# Patient Record
Sex: Female | Born: 1985 | Race: Black or African American | Hispanic: No | Marital: Single | Smoking: Never smoker
Health system: Southern US, Community
[De-identification: ages and names within clinical notes are randomized; demographics above are authoritative.]

## PROBLEM LIST (undated history)

## (undated) ENCOUNTER — Ambulatory Visit (HOSPITAL_COMMUNITY): Admission: EM | Payer: 59

## (undated) DIAGNOSIS — Z789 Other specified health status: Secondary | ICD-10-CM

## (undated) DIAGNOSIS — D649 Anemia, unspecified: Secondary | ICD-10-CM

## (undated) HISTORY — DX: Anemia, unspecified: D64.9

## (undated) HISTORY — PX: NO PAST SURGERIES: SHX2092

---

## 2010-10-21 ENCOUNTER — Inpatient Hospital Stay (HOSPITAL_COMMUNITY): Payer: Self-pay

## 2010-10-21 ENCOUNTER — Encounter (HOSPITAL_COMMUNITY): Payer: Self-pay | Admitting: *Deleted

## 2010-10-21 ENCOUNTER — Inpatient Hospital Stay (HOSPITAL_COMMUNITY)
Admission: AD | Admit: 2010-10-21 | Discharge: 2010-10-22 | Disposition: A | Discharge status: Home / Self Care | Payer: Self-pay | Source: Ambulatory Visit | Attending: Obstetrics & Gynecology | Admitting: Obstetrics & Gynecology

## 2010-10-21 DIAGNOSIS — O479 False labor, unspecified: Secondary | ICD-10-CM

## 2010-10-21 LAB — CBC
HCT: 31.1 % — ABNORMAL LOW (ref 36.0–46.0)
MCHC: 35 g/dL (ref 30.0–36.0)
RDW: 14.3 % (ref 11.5–15.5)

## 2010-10-21 LAB — DIFFERENTIAL
Basophils Absolute: 0 10*3/uL (ref 0.0–0.1)
Basophils Relative: 0 % (ref 0–1)
Eosinophils Absolute: 0 10*3/uL (ref 0.0–0.7)
Eosinophils Relative: 0 % (ref 0–5)
Monocytes Absolute: 0.6 10*3/uL (ref 0.1–1.0)
Neutro Abs: 4.9 10*3/uL (ref 1.7–7.7)

## 2010-10-21 LAB — URINALYSIS, ROUTINE W REFLEX MICROSCOPIC
Nitrite: NEGATIVE
Specific Gravity, Urine: 1.02 (ref 1.005–1.030)
Urobilinogen, UA: 0.2 mg/dL (ref 0.0–1.0)

## 2010-10-21 LAB — URINE MICROSCOPIC-ADD ON

## 2010-10-21 LAB — RAPID HIV SCREEN (WH-MAU): Rapid HIV Screen: NONREACTIVE

## 2010-10-21 NOTE — Progress Notes (Signed)
Pt states she has been having pain all week, but pain has become worse today at about 4pm

## 2010-10-21 NOTE — ED Provider Notes (Addendum)
Jasmine Silva is a 25 y.o. female G2P1 at [redacted]w[redacted]d presenting for contractions.  Contractions have been occuring on and off for several days, but increased in frequency and strength this afternoon.  States coming every 3-4 minutes for a while, then tapers off then back to every 3-4 minutes.    No loss of fluid or vaginal bleeding.  Good fetal movement.  Received prenatal care in [place], moved to [place] 4 weeks ago.  States no problems with this pregnancy; no diabetes or high blood pressure.  Prior pregnancy and delivery was normal.  Denies headache, n/v/d, edema.  History OB History    Grav Para Term Preterm Abortions TAB SAB Ect Mult Living   2 1 1  0 0 0 0 0 0 1     No past medical or surgical history  Family History: No history of problems in pregnancy or labor, no history of DVT, no history of DM or HTN Social History:  reports that she has never smoked. She has never used smokeless tobacco. She reports that she does not drink alcohol or use illicit drugs.  No Known Allergies Medications: Prenatal vitamin  ROS Please see HPI.    Blood pressure 130/71, pulse 100, temperature 98 F (36.7 C), temperature source Oral, resp. rate 18, last menstrual period 10/21/2010. Exam Physical Exam  General: alert, mild distress with contraction, well-nourished, well hydrated HENT: [place]/AT, MMM CV: RRR, no murmurs Pulm: CTAB, no wheezes, rales, rhonchi Abd: soft, +BS, gravid with size consistent with dates; vertex by leopold's Ext: no edema, good peripheral pulses  Cervix: 1/50/-3  FHT: 140, moderate variability, accels present, no decels Contractions: every 8 minutes  Prenatal labs: ABO, Rh:   Antibody:   Rubella:   RPR:    HBsAg:    HIV:    GBS:     Assessment/Plan: 25 year old G2P1 at [redacted]w[redacted]d presenting with intermittent contractions, braxton-hicks vs very early labor.  -prenatal labs, GBS -will also get BPP and AFI due to periods of non-reactive strip -labor precautions -discharge  home  BOOTH, ERIN 10/21/2010, 9:14 PM

## 2010-10-22 LAB — RPR: RPR Ser Ql: NONREACTIVE

## 2010-10-22 NOTE — Initial Assessments (Signed)
BPP and AFI ordered by Dr. Elwyn Reach for furhter evaluation, after discharge orders printed.  Pt discharged after reports from both tests.

## 2010-10-24 LAB — CULTURE, BETA STREP (GROUP B ONLY)

## 2010-10-27 ENCOUNTER — Encounter (HOSPITAL_COMMUNITY): Payer: Self-pay | Admitting: *Deleted

## 2010-10-27 ENCOUNTER — Telehealth (HOSPITAL_COMMUNITY): Payer: Self-pay | Admitting: *Deleted

## 2010-10-27 ENCOUNTER — Ambulatory Visit (INDEPENDENT_AMBULATORY_CARE_PROVIDER_SITE_OTHER): Payer: Self-pay | Admitting: Family Medicine

## 2010-10-27 DIAGNOSIS — O0930 Supervision of pregnancy with insufficient antenatal care, unspecified trimester: Secondary | ICD-10-CM

## 2010-10-27 DIAGNOSIS — O409XX0 Polyhydramnios, unspecified trimester, not applicable or unspecified: Secondary | ICD-10-CM

## 2010-10-27 DIAGNOSIS — O480 Post-term pregnancy: Secondary | ICD-10-CM

## 2010-10-27 LAB — POCT URINALYSIS DIP (DEVICE)
Hgb urine dipstick: NEGATIVE
Nitrite: NEGATIVE
Specific Gravity, Urine: 1.02 (ref 1.005–1.030)
Urobilinogen, UA: 0.2 mg/dL (ref 0.0–1.0)
pH: 7 (ref 5.0–8.0)

## 2010-10-27 NOTE — Progress Notes (Signed)
Subjective:    Jasmine Silva is a 25 y.o. female being seen today for her obstetrical visit. She is at [redacted]w[redacted]d gestation. Patient reports backache and contractions since 2 weeks as well as contractions for 2 weeks approximately 3x per day. She was admitted on 8/30 for contractions and at that time her cervix was 1/50/-3 and had prenatal labs at that time including GBS (-). Fetal movement: normal. No rush of fluid. No vaginal bleeding.   Patient moved from [place] 5 weeks ago to stay with her mother. She had prenatal care in [place] but did not bring records. She was told in [place] that she was having a healthy pregnancy. She reports no medical problems and an uncomplicated first pregnancy (including no hypertension or DM).   Menstrual History: OB History    Grav Para Term Preterm Abortions TAB SAB Ect Mult Living   2 1 1  0 0 0 0 0 0 1         The following portions of the patient's history were reviewed and updated as appropriate: allergies, current medications, past family history, past medical history, past social history, past surgical history and problem list.   Review of Systems Pertinent items are noted in HPI.   Objective:    BP 114/81  Temp 97 F (36.1 C)  Wt 170 lb 12.8 oz (77.474 kg)  LMP 10/21/2010 FHT: 140 BPM  Uterine Size: 40 cm  Presentations: vertex  Pelvic Exam:              Dilation: 0.5 fingertip       Effacement: soft   Station:  -3     Consistency: soft        NST today with 140 BPM, irregular cx, reactive, no decreased variability Assessment:   [name] is a 25 y.o. female being seen today for her obstetrical visit. She is at [redacted]w[redacted]d gestation. Patient interested in induction.  Prenatal labs unremarkable-O Pos. Hep B/HIV/Syphilis negative. Rubella immune. GBS negative.   Plan:    Postdates management: discussed fetal surveillance and induction, NST reactive. Induction: scheduled for 10/29/10 at 7:30 AM.  Follow up for bleeding, contractions,  rush of fluid, or decreased fetal movement.

## 2010-10-27 NOTE — Telephone Encounter (Signed)
Preadmission screen  

## 2010-10-27 NOTE — Progress Notes (Signed)
Having some pelvic pain and pressure. Pt given education materials.

## 2010-10-27 NOTE — Progress Notes (Signed)
Pt is unsure if she wants IOL vs waiting for spont. labor

## 2010-10-29 ENCOUNTER — Encounter (HOSPITAL_COMMUNITY): Payer: Self-pay

## 2010-10-29 ENCOUNTER — Inpatient Hospital Stay (HOSPITAL_COMMUNITY)
Admission: RE | Admit: 2010-10-29 | Discharge: 2010-11-02 | DRG: 765 | Disposition: A | Discharge status: Home / Self Care | Payer: Medicaid Other | Source: Ambulatory Visit | Attending: Family Medicine | Admitting: Family Medicine

## 2010-10-29 DIAGNOSIS — O480 Post-term pregnancy: Principal | ICD-10-CM | POA: Diagnosis present

## 2010-10-29 DIAGNOSIS — O411090 Infection of amniotic sac and membranes, unspecified, unspecified trimester, not applicable or unspecified: Secondary | ICD-10-CM | POA: Diagnosis present

## 2010-10-29 HISTORY — DX: Other specified health status: Z78.9

## 2010-10-29 LAB — RPR: RPR Ser Ql: NONREACTIVE

## 2010-10-29 LAB — CBC
Platelets: 191 10*3/uL (ref 150–400)
RDW: 14.3 % (ref 11.5–15.5)
WBC: 6.9 10*3/uL (ref 4.0–10.5)

## 2010-10-29 MED ORDER — TERBUTALINE SULFATE 1 MG/ML IJ SOLN: 0.2500 mg | Freq: Once | INTRAMUSCULAR | Status: AC | PRN

## 2010-10-29 MED ORDER — PHENYLEPHRINE 40 MCG/ML (10ML) SYRINGE FOR IV PUSH (FOR BLOOD PRESSURE SUPPORT)
80.0000 ug | PREFILLED_SYRINGE | INTRAVENOUS | Status: DC | PRN
  Filled 2010-10-29: qty 5

## 2010-10-29 MED ORDER — OXYTOCIN 20 UNITS IN LACTATED RINGERS INFUSION - SIMPLE
125.0000 mL/h | Freq: Once | INTRAVENOUS | Status: DC
  Filled 2010-10-29: qty 1000

## 2010-10-29 MED ORDER — OXYTOCIN 20 UNITS IN LACTATED RINGERS INFUSION - SIMPLE
1.0000 m[IU]/min | INTRAVENOUS | Status: DC
  Administered 2010-10-29: 18 m[IU]/min via INTRAVENOUS
  Administered 2010-10-29: 10 m[IU]/min via INTRAVENOUS
  Administered 2010-10-29: 12 m[IU]/min via INTRAVENOUS
  Administered 2010-10-29: 20 m[IU]/min via INTRAVENOUS
  Administered 2010-10-29: 14 m[IU]/min via INTRAVENOUS
  Administered 2010-10-29: 8 m[IU]/min via INTRAVENOUS
  Administered 2010-10-29: 16 m[IU]/min via INTRAVENOUS
  Administered 2010-10-29: 2 m[IU]/min via INTRAVENOUS
  Administered 2010-10-30: 22 m[IU]/min via INTRAVENOUS
  Administered 2010-10-30: 26 m[IU]/min via INTRAVENOUS
  Administered 2010-10-30: 24 m[IU]/min via INTRAVENOUS

## 2010-10-29 MED ORDER — LIDOCAINE HCL (PF) 1 % IJ SOLN
30.0000 mL | INTRAMUSCULAR | Status: DC | PRN
  Filled 2010-10-29: qty 30

## 2010-10-29 MED ORDER — OXYTOCIN BOLUS FROM INFUSION
500.0000 mL | Freq: Once | INTRAVENOUS | Status: DC
  Filled 2010-10-29: qty 1000
  Filled 2010-10-29: qty 500

## 2010-10-29 MED ORDER — LACTATED RINGERS IV SOLN
500.0000 mL | INTRAVENOUS | Status: DC | PRN
  Administered 2010-10-30 (×2): 500 mL via INTRAVENOUS

## 2010-10-29 MED ORDER — MISOPROSTOL 25 MCG QUARTER TABLET
25.0000 ug | ORAL_TABLET | ORAL | Status: DC | PRN
  Administered 2010-10-29: 25 ug via VAGINAL
  Filled 2010-10-29: qty 1
  Filled 2010-10-29 (×2): qty 0.25

## 2010-10-29 MED ORDER — OXYCODONE-ACETAMINOPHEN 5-325 MG PO TABS: 2.0000 | ORAL_TABLET | ORAL | Status: DC | PRN

## 2010-10-29 MED ORDER — CITRIC ACID-SODIUM CITRATE 334-500 MG/5ML PO SOLN
30.0000 mL | ORAL | Status: DC | PRN
  Filled 2010-10-29: qty 15

## 2010-10-29 MED ORDER — IBUPROFEN 600 MG PO TABS
600.0000 mg | ORAL_TABLET | Freq: Four times a day (QID) | ORAL | Status: DC | PRN
  Filled 2010-10-29: qty 1

## 2010-10-29 MED ORDER — DIPHENHYDRAMINE HCL 50 MG/ML IJ SOLN: 12.5000 mg | INTRAMUSCULAR | Status: DC | PRN

## 2010-10-29 MED ORDER — FLEET ENEMA 7-19 GM/118ML RE ENEM: 1.0000 | ENEMA | RECTAL | Status: DC | PRN

## 2010-10-29 MED ORDER — ONDANSETRON HCL 4 MG/2ML IJ SOLN: 4.0000 mg | Freq: Four times a day (QID) | INTRAMUSCULAR | Status: DC | PRN

## 2010-10-29 MED ORDER — ACETAMINOPHEN 325 MG PO TABS
650.0000 mg | ORAL_TABLET | ORAL | Status: DC | PRN
  Administered 2010-10-30: 650 mg via ORAL
  Filled 2010-10-29: qty 2

## 2010-10-29 MED ORDER — LACTATED RINGERS IV SOLN
INTRAVENOUS | Status: DC
  Administered 2010-10-29 – 2010-10-30 (×8): via INTRAVENOUS

## 2010-10-29 MED ORDER — PHENYLEPHRINE 40 MCG/ML (10ML) SYRINGE FOR IV PUSH (FOR BLOOD PRESSURE SUPPORT)
80.0000 ug | PREFILLED_SYRINGE | INTRAVENOUS | Status: DC | PRN
  Filled 2010-10-29 (×2): qty 5

## 2010-10-29 MED ORDER — EPHEDRINE 5 MG/ML INJ
10.0000 mg | INTRAVENOUS | Status: DC | PRN
  Filled 2010-10-29 (×2): qty 4

## 2010-10-29 MED ORDER — LACTATED RINGERS IV SOLN: 500.0000 mL | Freq: Once | INTRAVENOUS | Status: DC

## 2010-10-29 MED ORDER — EPHEDRINE 5 MG/ML INJ
10.0000 mg | INTRAVENOUS | Status: DC | PRN
  Filled 2010-10-29: qty 4

## 2010-10-29 MED ORDER — BUTORPHANOL TARTRATE 2 MG/ML IJ SOLN
2.0000 mg | INTRAMUSCULAR | Status: DC | PRN
  Administered 2010-10-29: 2 mg via INTRAVENOUS
  Filled 2010-10-29: qty 1

## 2010-10-29 MED ORDER — FENTANYL 2.5 MCG/ML BUPIVACAINE 1/10 % EPIDURAL INFUSION (WH - ANES)
14.0000 mL/h | INTRAMUSCULAR | Status: DC
  Administered 2010-10-30 (×2): 14 mL/h via EPIDURAL
  Filled 2010-10-29 (×3): qty 60

## 2010-10-29 NOTE — Progress Notes (Signed)
Jasmine Silva is a 25 y.o. G2P1001 at [redacted]w[redacted]d by LMP admitted for induction of labor due to Post dates.  Subjective: Patient sleeping.  Per nursing, pt states contractions every 8-10 minutes, having some back pain.  Objective: BP 129/73  Pulse 93  Temp(Src) 98 F (36.7 C) (Oral)  Resp 20  Ht 5\' 2"  (1.575 m)  Wt 174 lb (78.926 kg)  BMI 31.83 kg/m2  LMP 10/21/2010      FHT:  FHR: 135 bpm, variability: moderate,  accelerations:  Abscent,  decelerations:  Absent UC:   irregular, every 8-10 minutes SVE:   Dilation: Fingertip Effacement (%): 50 Station: -3 Exam by:: Dr Elwyn Reach  Labs: Lab Results  Component Value Date   WBC 6.9 10/29/2010   HGB 10.7* 10/29/2010   HCT 30.9* 10/29/2010   MCV 79.4 10/29/2010   PLT 191 10/29/2010    Assessment / Plan: Induction of labor due to postterm, on cytotec  Labor: cytotec placed at 0930 Preeclampsia:  n/a Fetal Wellbeing:  Category I Pain Control:  Labor support without medications and planning on epidural I/D:  n/a Anticipated MOD:  NSVD  BOOTH, Adelina Collard 10/29/2010, 11:43 AM

## 2010-10-29 NOTE — H&P (Signed)
Agree with above H&P documentation.

## 2010-10-29 NOTE — Plan of Care (Signed)
Problem: Consults Goal: Birthing Suites Patient Information Press F2 to bring up selections list   Pt > [redacted] weeks EGA     

## 2010-10-29 NOTE — Progress Notes (Signed)
Dewayne Jurek is a 25 y.o. G2P1001 at [redacted]w[redacted]d by LMP admitted for induction of labor due to Post dates. Due date 78 Sept 2012.  Subjective:   Objective: BP 116/63  Pulse 91  Temp(Src) 98 F (36.7 C) (Oral)  Resp 20  Ht 5\' 2"  (1.575 m)  Wt 78.926 kg (174 lb)  BMI 31.83 kg/m2  LMP 10/21/2010      FHT:  FHR: 135 bpm, variability: moderate,  accelerations:  Present,  decelerations:  Absent UC:   none SVE:   Dilation: 1 Effacement (%): 50 Station: -3 Exam by:: Dr Natale Milch  Labs: Lab Results  Component Value Date   WBC 6.9 10/29/2010   HGB 10.7* 10/29/2010   HCT 30.9* 10/29/2010   MCV 79.4 10/29/2010   PLT 191 10/29/2010    Assessment / Plan: IOL: Foley Bulb placed Fetal Wellbeing:  Category I Pain Control:  Labor support without medications I/D:  n/a Anticipated MOD:  NSVD  Demir Titsworth N 10/29/2010, 1:37 PM

## 2010-10-29 NOTE — Progress Notes (Signed)
Jasmine Silva is a 25 y.o. G2P1001 at [redacted]w[redacted]d, IOL for Postdates. Had Foley Bulb placed at 1330. Has been walking and noticed some blood tinge when last in restroom. Objective: BP 114/75  Pulse 86  Temp(Src) 98.6 F (37 C) (Oral)  Resp 20  Ht 5\' 2"  (1.575 m)  Wt 78.926 kg (174 lb)  BMI 31.83 kg/m2  LMP 10/21/2010      FHT:  FHR: 140s bpm, variability: moderate,  accelerations:  Present,  decelerations:  Absent UC:   regular, every 10 minutes SVE:   Dilation: 4 Effacement (%): 50 Station: -3 Exam by:: Dr Bradly Chris removed  Assessment / Plan: IOL progressing well. Will start pitocin now that foley out. Discussed with patient pain managment options.  Labor: Progressing normally Fetal Wellbeing:  Category I Pain Control:  Labor support without medications I/D:  n/a Anticipated MOD:  NSVD  Jasmine Silva N 10/29/2010, 5:35 PM

## 2010-10-29 NOTE — Progress Notes (Signed)
Dr. Elesa Massed Resident

## 2010-10-29 NOTE — Progress Notes (Signed)
Jasmine Silva is a 25 y.o. G2P1001 at [redacted]w[redacted]d Subjective: IOL for post-dates. S/p cytotec and FB. FB out @1730 . Resting comfortably on stadol. No complaints.  Objective: BP 117/71  Pulse 85  Temp(Src) 97.7 F (36.5 C) (Oral)  Resp 18  Ht 5\' 2"  (1.575 m)  Wt 174 lb (78.926 kg)  BMI 31.83 kg/m2  LMP 10/21/2010      FHT:  FHR: 130 bpm, variability: moderate,  accelerations:  Abscent,  decelerations:  Absent UC:   irregular, every 2-6 minutes SVE:   Dilation: 5 Effacement (%): 50 Station: -3 Exam by:: Dr. Natale Milch  Labs: Lab Results  Component Value Date   WBC 6.9 10/29/2010   HGB 10.7* 10/29/2010   HCT 30.9* 10/29/2010   MCV 79.4 10/29/2010   PLT 191 10/29/2010    Assessment / Plan: Induction of labor due to postterm,  progressing well on pitocin  Labor: Progressing Fetal Wellbeing:  Category I; some flattening of strip, likely 2/2 to stadol. Will continue to monitor Pain Control:  stadol Anticipated MOD:  NSVD  Jasmine Silva, [name] 10/29/2010, 7:56 PM

## 2010-10-29 NOTE — H&P (Signed)
Jasmine Silva is a 25 y.o. female G2P1001 at [redacted]w[redacted]d per patient report  presenting for IOL for post-dates. Reports contractions every 8-10 min.  Denies any loss of fluid.  +FM.  Denies any complications during pregnancy.  Received most of her prenatal care in home country of [place].  Denies any existing medical conditions prior to pregnancy.    Medications:  Prenatal Vitamins Allergies: No known allergies  History   OB History    Grav Para Term Preterm Abortions TAB SAB Ect Mult Living   2 1 1  0 0 0 0 0 0 1     Past Medical History  Diagnosis Date   No pertinent past medical history    Past Surgical History  Procedure Date   No past surgeries    Family History: No pertinent family history.   Social History:  reports that she has never smoked. She has never used smokeless tobacco. She reports that she does not drink alcohol or use illicit drugs.    ROS  Negative except as in HPI  Dilation: Fingertip Effacement (%): 50 Station: -3 Exam by:: Dr Elwyn Reach Blood pressure 132/79, pulse 95, temperature 97.9 F (36.6 C), temperature source Oral, resp. rate 20, height 5\' 2"  (1.575 m), weight 174 lb (78.926 kg), last menstrual period 10/21/2010. Exam Physical Exam  Gen: Alert, oriented x 3. Cooperative.  Some discomfort with contractions HEENT:  Normocephalic, atraumatic, sclera anicteric. Card:  Regular rate and rhythm. S1, S2.  No murmur/gallop/rubs Pulm:  CTAB.  No retractions or respiratory distress.  Abd:  Gravid, non-tender. Ext: no edema, warm and dry.  No erythema.  Distal pulses intact and strong.  FHT: 145, Mild variability, No Accel, No decel.  Contractions every 8-10 min     Prenatal labs: ABO, Rh: --/--/O POS (08/30 2125) Antibody: NEG (08/30 2123) Rubella:  Immune RPR: NON REACTIVE (08/30 2124)  HBsAg: NEGATIVE (08/30 2124)  HIV: Non-reactive (08/30 0000)  GBS: Negative (08/30 0000)   Assessment/Plan: 25 yo G2P1001 at [redacted]w[redacted]d presenting for IOL for post-dates.   GBS  Neg/Rubella Immune. Bishop Score 4. 1)Admit to L & D 2)Cytotec for cervical ripening 3) Continue routine monitoring   Adam Chenevert PA-S  10/29/2010, 9:01 AM

## 2010-10-29 NOTE — Progress Notes (Signed)
Jasmine Silva is a 25 y.o. G2P1001 at [redacted]w[redacted]d IOL for post-dates  Subjective: Doing well. Pain well-controlled. Still feeling contractions.   Objective: BP 130/82  Pulse 83  Temp(Src) 97.7 F (36.5 C) (Oral)  Resp 16  Ht 5\' 2"  (1.575 m)  Wt 174 lb (78.926 kg)  BMI 31.83 kg/m2  LMP 10/21/2010      FHT:  FHR: 130 bpm, variability: moderate,  accelerations:  Present,  decelerations:  Absent UC:   regular, every 4-5 minutes SVE:   Dilation: 5 Effacement (%): 50 Station: -3 Exam by::  (Nivaan Dicenzo MD)  Labs: Lab Results  Component Value Date   WBC 6.9 10/29/2010   HGB 10.7* 10/29/2010   HCT 30.9* 10/29/2010   MCV 79.4 10/29/2010   PLT 191 10/29/2010    Assessment / Plan: IOL for post-dates. Continue pitocin. No progress since last exam, however patient is s/p foley bulb.  Labor: Progressing on Pitocin, will continue to increase then AROM Fetal Wellbeing:  Category I Pain Control:  PO/IV meds Anticipated MOD:  NSVD  Ezra Marquess, Raina Mina 10/29/2010, 9:37 PM

## 2010-10-30 ENCOUNTER — Encounter (HOSPITAL_COMMUNITY)
Admission: RE | Disposition: A | Discharge status: Home / Self Care | Payer: Self-pay | Source: Ambulatory Visit | Attending: Family Medicine

## 2010-10-30 ENCOUNTER — Other Ambulatory Visit: Payer: Self-pay | Admitting: Obstetrics & Gynecology

## 2010-10-30 ENCOUNTER — Encounter (HOSPITAL_COMMUNITY): Payer: Self-pay

## 2010-10-30 ENCOUNTER — Encounter (HOSPITAL_COMMUNITY): Payer: Self-pay | Admitting: Anesthesiology

## 2010-10-30 ENCOUNTER — Inpatient Hospital Stay (HOSPITAL_COMMUNITY): Payer: Medicaid Other | Admitting: Anesthesiology

## 2010-10-30 DIAGNOSIS — O480 Post-term pregnancy: Secondary | ICD-10-CM

## 2010-10-30 DIAGNOSIS — O411090 Infection of amniotic sac and membranes, unspecified, unspecified trimester, not applicable or unspecified: Secondary | ICD-10-CM

## 2010-10-30 SURGERY — Surgical Case
Anesthesia: Regional

## 2010-10-30 SURGERY — Surgical Case
Anesthesia: Regional | Site: Abdomen | Wound class: Clean Contaminated

## 2010-10-30 MED ORDER — SODIUM BICARBONATE 8.4 % IV SOLN
INTRAVENOUS | Status: DC | PRN
  Administered 2010-10-30: 5 mL via EPIDURAL

## 2010-10-30 MED ORDER — OXYTOCIN 20 UNITS IN LACTATED RINGERS INFUSION - SIMPLE
125.0000 mL/h | INTRAVENOUS | Status: AC
  Administered 2010-10-30: 125 mL/h via INTRAVENOUS

## 2010-10-30 MED ORDER — NALBUPHINE HCL 10 MG/ML IJ SOLN
5.0000 mg | INTRAMUSCULAR | Status: DC | PRN
  Filled 2010-10-30: qty 1

## 2010-10-30 MED ORDER — PHENYLEPHRINE HCL 10 MG/ML IJ SOLN
INTRAMUSCULAR | Status: DC | PRN
  Administered 2010-10-30 (×2): 80 ug via INTRAVENOUS
  Administered 2010-10-30: 120 ug via INTRAVENOUS
  Administered 2010-10-30: 80 ug via INTRAVENOUS
  Administered 2010-10-30: 120 ug via INTRAVENOUS
  Administered 2010-10-30: 80 ug via INTRAVENOUS
  Administered 2010-10-30: 120 ug via INTRAVENOUS
  Administered 2010-10-30: 80 ug via INTRAVENOUS
  Administered 2010-10-30 (×2): 120 ug via INTRAVENOUS
  Administered 2010-10-30: 80 ug via INTRAVENOUS
  Administered 2010-10-30: 120 ug via INTRAVENOUS

## 2010-10-30 MED ORDER — MORPHINE SULFATE (PF) 0.5 MG/ML IJ SOLN
INTRAMUSCULAR | Status: DC | PRN
  Administered 2010-10-30: 4 mg via EPIDURAL

## 2010-10-30 MED ORDER — GENTAMICIN SULFATE 40 MG/ML IJ SOLN
160.0000 mg | Freq: Once | INTRAVENOUS | Status: AC
  Administered 2010-10-30: 160 mg via INTRAVENOUS
  Filled 2010-10-30: qty 4

## 2010-10-30 MED ORDER — NALOXONE HCL 0.4 MG/ML IJ SOLN: 0.4000 mg | INTRAMUSCULAR | Status: DC | PRN

## 2010-10-30 MED ORDER — SODIUM CHLORIDE 0.9 % IR SOLN
Status: DC | PRN
  Administered 2010-10-30: 1000 mL

## 2010-10-30 MED ORDER — DIPHENHYDRAMINE HCL 25 MG PO CAPS: 25.0000 mg | ORAL_CAPSULE | ORAL | Status: DC | PRN

## 2010-10-30 MED ORDER — SODIUM CHLORIDE 0.9 % IV SOLN
2.0000 g | Freq: Four times a day (QID) | INTRAVENOUS | Status: DC
  Administered 2010-10-30 – 2010-11-01 (×7): 2 g via INTRAVENOUS
  Filled 2010-10-30 (×11): qty 2000

## 2010-10-30 MED ORDER — DIPHENHYDRAMINE HCL 25 MG PO CAPS: 25.0000 mg | ORAL_CAPSULE | Freq: Four times a day (QID) | ORAL | Status: DC | PRN

## 2010-10-30 MED ORDER — FENTANYL CITRATE 0.05 MG/ML IJ SOLN
INTRAMUSCULAR | Status: AC
  Filled 2010-10-30: qty 2

## 2010-10-30 MED ORDER — PHENYLEPHRINE 40 MCG/ML (10ML) SYRINGE FOR IV PUSH (FOR BLOOD PRESSURE SUPPORT)
PREFILLED_SYRINGE | INTRAVENOUS | Status: AC
  Filled 2010-10-30: qty 5

## 2010-10-30 MED ORDER — SODIUM CHLORIDE 0.9 % IJ SOLN
3.0000 mL | INTRAMUSCULAR | Status: DC | PRN
  Administered 2010-11-01 (×2): 3 mL via INTRAVENOUS

## 2010-10-30 MED ORDER — SODIUM CHLORIDE 0.9 % IV SOLN
1.0000 ug/kg/h | INTRAVENOUS | Status: DC | PRN
  Filled 2010-10-30: qty 2.5

## 2010-10-30 MED ORDER — GENTAMICIN SULFATE 40 MG/ML IJ SOLN
150.0000 mg | Freq: Three times a day (TID) | INTRAVENOUS | Status: DC
  Administered 2010-10-30 – 2010-11-01 (×5): 150 mg via INTRAVENOUS
  Filled 2010-10-30 (×7): qty 3.75

## 2010-10-30 MED ORDER — KETOROLAC TROMETHAMINE 30 MG/ML IJ SOLN: 30.0000 mg | Freq: Four times a day (QID) | INTRAMUSCULAR | Status: AC | PRN

## 2010-10-30 MED ORDER — KETOROLAC TROMETHAMINE 60 MG/2ML IM SOLN
60.0000 mg | Freq: Once | INTRAMUSCULAR | Status: AC | PRN
  Filled 2010-10-30: qty 2

## 2010-10-30 MED ORDER — SENNOSIDES-DOCUSATE SODIUM 8.6-50 MG PO TABS
2.0000 | ORAL_TABLET | Freq: Every day | ORAL | Status: DC
  Administered 2010-11-01: 2 via ORAL

## 2010-10-30 MED ORDER — MORPHINE SULFATE (PF) 0.5 MG/ML IJ SOLN
INTRAMUSCULAR | Status: DC | PRN
  Administered 2010-10-30: 1 mg via INTRAVENOUS

## 2010-10-30 MED ORDER — CEFAZOLIN SODIUM 1-5 GM-% IV SOLN
INTRAVENOUS | Status: AC
  Filled 2010-10-30: qty 100

## 2010-10-30 MED ORDER — ONDANSETRON HCL 4 MG/2ML IJ SOLN: 4.0000 mg | INTRAMUSCULAR | Status: DC | PRN

## 2010-10-30 MED ORDER — SIMETHICONE 80 MG PO CHEW
80.0000 mg | CHEWABLE_TABLET | Freq: Three times a day (TID) | ORAL | Status: DC
  Administered 2010-10-31 – 2010-11-02 (×8): 80 mg via ORAL

## 2010-10-30 MED ORDER — KETOROLAC TROMETHAMINE 30 MG/ML IJ SOLN
30.0000 mg | Freq: Four times a day (QID) | INTRAMUSCULAR | Status: AC | PRN
  Administered 2010-10-31: 30 mg via INTRAVENOUS
  Filled 2010-10-30: qty 1

## 2010-10-30 MED ORDER — MORPHINE SULFATE 0.5 MG/ML IJ SOLN
INTRAMUSCULAR | Status: AC
  Filled 2010-10-30: qty 10

## 2010-10-30 MED ORDER — SIMETHICONE 80 MG PO CHEW: 80.0000 mg | CHEWABLE_TABLET | ORAL | Status: DC | PRN

## 2010-10-30 MED ORDER — ACETAMINOPHEN 325 MG PO TABS
650.0000 mg | ORAL_TABLET | Freq: Four times a day (QID) | ORAL | Status: DC | PRN
  Administered 2010-10-30: 650 mg via ORAL
  Filled 2010-10-30: qty 2

## 2010-10-30 MED ORDER — LACTATED RINGERS IV SOLN
INTRAVENOUS | Status: DC
  Administered 2010-10-31 (×2): via INTRAVENOUS

## 2010-10-30 MED ORDER — OXYTOCIN 20 UNITS IN LACTATED RINGERS INFUSION - SIMPLE
INTRAVENOUS | Status: AC
  Filled 2010-10-30: qty 1000

## 2010-10-30 MED ORDER — TETANUS-DIPHTH-ACELL PERTUSSIS 5-2.5-18.5 LF-MCG/0.5 IM SUSP
0.5000 mL | Freq: Once | INTRAMUSCULAR | Status: AC
  Administered 2010-11-01: 0.5 mL via INTRAMUSCULAR
  Filled 2010-10-30: qty 0.5

## 2010-10-30 MED ORDER — SCOPOLAMINE 1 MG/3DAYS TD PT72
1.0000 | MEDICATED_PATCH | Freq: Once | TRANSDERMAL | Status: DC
  Administered 2010-10-30: 1.5 mg via TRANSDERMAL

## 2010-10-30 MED ORDER — OXYCODONE-ACETAMINOPHEN 5-325 MG PO TABS
1.0000 | ORAL_TABLET | ORAL | Status: DC | PRN
  Administered 2010-10-31 – 2010-11-01 (×4): 1 via ORAL
  Administered 2010-11-01: 2 via ORAL
  Filled 2010-10-30: qty 1
  Filled 2010-10-30: qty 2
  Filled 2010-10-30 (×3): qty 1

## 2010-10-30 MED ORDER — ONDANSETRON HCL 4 MG PO TABS: 4.0000 mg | ORAL_TABLET | ORAL | Status: DC | PRN

## 2010-10-30 MED ORDER — MEPERIDINE HCL 25 MG/ML IJ SOLN
INTRAMUSCULAR | Status: AC
  Administered 2010-10-30: 12.5 mg via INTRAVENOUS
  Filled 2010-10-30: qty 1

## 2010-10-30 MED ORDER — OXYTOCIN 10 UNIT/ML IJ SOLN
INTRAMUSCULAR | Status: AC
  Filled 2010-10-30: qty 4

## 2010-10-30 MED ORDER — MEPERIDINE HCL 25 MG/ML IJ SOLN
INTRAMUSCULAR | Status: DC | PRN
  Administered 2010-10-30: 25 mg via INTRAVENOUS

## 2010-10-30 MED ORDER — ONDANSETRON HCL 4 MG/2ML IJ SOLN
INTRAMUSCULAR | Status: DC | PRN
  Administered 2010-10-30: 4 mg via INTRAVENOUS

## 2010-10-30 MED ORDER — PHENYLEPHRINE 40 MCG/ML (10ML) SYRINGE FOR IV PUSH (FOR BLOOD PRESSURE SUPPORT)
PREFILLED_SYRINGE | INTRAVENOUS | Status: AC
  Filled 2010-10-30: qty 10

## 2010-10-30 MED ORDER — DIBUCAINE 1 % RE OINT: 1.0000 "application " | TOPICAL_OINTMENT | RECTAL | Status: DC | PRN

## 2010-10-30 MED ORDER — CEFAZOLIN SODIUM 1-5 GM-% IV SOLN
INTRAVENOUS | Status: DC | PRN
  Administered 2010-10-30: 1 g via INTRAVENOUS

## 2010-10-30 MED ORDER — LACTATED RINGERS IV SOLN
INTRAVENOUS | Status: DC | PRN
  Administered 2010-10-30: 18:00:00 via INTRAVENOUS

## 2010-10-30 MED ORDER — MENTHOL 3 MG MT LOZG: 1.0000 | LOZENGE | OROMUCOSAL | Status: DC | PRN

## 2010-10-30 MED ORDER — FENTANYL CITRATE 0.05 MG/ML IJ SOLN
INTRAMUSCULAR | Status: DC | PRN
  Administered 2010-10-30: 100 ug via INTRAVENOUS

## 2010-10-30 MED ORDER — MEPERIDINE HCL 25 MG/ML IJ SOLN
INTRAMUSCULAR | Status: AC
  Filled 2010-10-30: qty 1

## 2010-10-30 MED ORDER — OXYTOCIN 20 UNITS IN LACTATED RINGERS INFUSION - SIMPLE
1.0000 m[IU]/min | INTRAVENOUS | Status: DC
  Administered 2010-10-30: 14 m[IU]/min via INTRAVENOUS

## 2010-10-30 MED ORDER — IBUPROFEN 600 MG PO TABS
600.0000 mg | ORAL_TABLET | Freq: Four times a day (QID) | ORAL | Status: DC | PRN
  Filled 2010-10-30 (×4): qty 1

## 2010-10-30 MED ORDER — DIPHENHYDRAMINE HCL 50 MG/ML IJ SOLN: 25.0000 mg | INTRAMUSCULAR | Status: DC | PRN

## 2010-10-30 MED ORDER — DIPHENHYDRAMINE HCL 50 MG/ML IJ SOLN: 12.5000 mg | INTRAMUSCULAR | Status: DC | PRN

## 2010-10-30 MED ORDER — ACETAMINOPHEN 500 MG PO TABS
500.0000 mg | ORAL_TABLET | Freq: Four times a day (QID) | ORAL | Status: AC | PRN
  Administered 2010-10-30: 500 mg via ORAL

## 2010-10-30 MED ORDER — FENTANYL 2.5 MCG/ML BUPIVACAINE 1/10 % EPIDURAL INFUSION (WH - ANES)
INTRAMUSCULAR | Status: DC | PRN
  Administered 2010-10-30: 14 mL/h via EPIDURAL

## 2010-10-30 MED ORDER — PRENATAL PLUS 27-1 MG PO TABS
1.0000 | ORAL_TABLET | Freq: Every day | ORAL | Status: DC
  Administered 2010-10-31 – 2010-11-02 (×3): 1 via ORAL
  Filled 2010-10-30 (×3): qty 1

## 2010-10-30 MED ORDER — ONDANSETRON HCL 4 MG/2ML IJ SOLN: 4.0000 mg | Freq: Three times a day (TID) | INTRAMUSCULAR | Status: DC | PRN

## 2010-10-30 MED ORDER — MEPERIDINE HCL 25 MG/ML IJ SOLN
6.2500 mg | INTRAMUSCULAR | Status: DC | PRN
  Administered 2010-10-30: 12.5 mg via INTRAVENOUS

## 2010-10-30 MED ORDER — SCOPOLAMINE 1 MG/3DAYS TD PT72
MEDICATED_PATCH | TRANSDERMAL | Status: AC
  Administered 2010-10-30: 1.5 mg via TRANSDERMAL
  Filled 2010-10-30: qty 1

## 2010-10-30 MED ORDER — OXYTOCIN 10 UNIT/ML IJ SOLN
20.0000 [IU] | INTRAVENOUS | Status: DC | PRN
  Administered 2010-10-30: 20 [IU] via INTRAVENOUS

## 2010-10-30 MED ORDER — LANOLIN HYDROUS EX OINT: 1.0000 "application " | TOPICAL_OINTMENT | CUTANEOUS | Status: DC | PRN

## 2010-10-30 MED ORDER — FENTANYL CITRATE 0.05 MG/ML IJ SOLN: 25.0000 ug | INTRAMUSCULAR | Status: DC | PRN

## 2010-10-30 MED ORDER — LIDOCAINE HCL 1.5 % IJ SOLN
INTRAMUSCULAR | Status: DC | PRN
  Administered 2010-10-30 (×2): 5 mL via EPIDURAL

## 2010-10-30 MED ORDER — ZOLPIDEM TARTRATE 5 MG PO TABS: 5.0000 mg | ORAL_TABLET | Freq: Every evening | ORAL | Status: DC | PRN

## 2010-10-30 MED ORDER — METOCLOPRAMIDE HCL 5 MG/ML IJ SOLN: 10.0000 mg | Freq: Three times a day (TID) | INTRAMUSCULAR | Status: DC | PRN

## 2010-10-30 MED ORDER — IBUPROFEN 600 MG PO TABS
600.0000 mg | ORAL_TABLET | Freq: Four times a day (QID) | ORAL | Status: DC
  Administered 2010-10-31 – 2010-11-02 (×9): 600 mg via ORAL
  Filled 2010-10-30 (×5): qty 1

## 2010-10-30 MED ORDER — ONDANSETRON HCL 4 MG/2ML IJ SOLN
INTRAMUSCULAR | Status: AC
  Filled 2010-10-30: qty 2

## 2010-10-30 MED ORDER — WITCH HAZEL-GLYCERIN EX PADS: 1.0000 "application " | MEDICATED_PAD | CUTANEOUS | Status: DC | PRN

## 2010-10-30 SURGICAL SUPPLY — 27 items
CHLORAPREP W/TINT 26ML (MISCELLANEOUS) ×2 IMPLANT
CLOTH BEACON ORANGE TIMEOUT ST (SAFETY) ×2 IMPLANT
DRSG COVADERM 4X10 (GAUZE/BANDAGES/DRESSINGS) ×2 IMPLANT
DRSG PAD ABDOMINAL 8X10 ST (GAUZE/BANDAGES/DRESSINGS) ×2 IMPLANT
ELECT REM PT RETURN 9FT ADLT (ELECTROSURGICAL) ×2
ELECTRODE REM PT RTRN 9FT ADLT (ELECTROSURGICAL) ×1 IMPLANT
EXTRACTOR VACUUM KIWI (MISCELLANEOUS) IMPLANT
GLOVE BIO SURGEON STRL SZ 6.5 (GLOVE) ×4 IMPLANT
GLOVE BIOGEL PI IND STRL 7.0 (GLOVE) ×3 IMPLANT
GLOVE BIOGEL PI INDICATOR 7.0 (GLOVE) ×3
GLOVE SKINSENSE NS SZ6.5 (GLOVE) ×2
GLOVE SKINSENSE STRL SZ6.5 (GLOVE) ×2 IMPLANT
GOWN PREVENTION PLUS LG XLONG (DISPOSABLE) ×6 IMPLANT
KIT ABG SYR 3ML LUER SLIP (SYRINGE) ×2 IMPLANT
NEEDLE HYPO 25X5/8 SAFETYGLIDE (NEEDLE) ×2 IMPLANT
NS IRRIG 1000ML POUR BTL (IV SOLUTION) ×2 IMPLANT
PACK C SECTION WH (CUSTOM PROCEDURE TRAY) ×2 IMPLANT
SLEEVE SCD COMPRESS KNEE LRG (MISCELLANEOUS) IMPLANT
SLEEVE SCD COMPRESS KNEE MED (MISCELLANEOUS) ×2 IMPLANT
SUT VIC AB 0 CT1 36 (SUTURE) ×12 IMPLANT
SUT VIC AB 2-0 CT1 27 (SUTURE) ×1
SUT VIC AB 2-0 CT1 TAPERPNT 27 (SUTURE) ×1 IMPLANT
SUT VIC AB 4-0 PS2 27 (SUTURE) ×2 IMPLANT
TAPE CLOTH SURG 4X10 WHT LF (GAUZE/BANDAGES/DRESSINGS) ×2 IMPLANT
TOWEL OR 17X24 6PK STRL BLUE (TOWEL DISPOSABLE) ×2 IMPLANT
TRAY FOLEY CATH 14FR (SET/KITS/TRAYS/PACK) IMPLANT
WATER STERILE IRR 1000ML POUR (IV SOLUTION) ×2 IMPLANT

## 2010-10-30 NOTE — Progress Notes (Signed)
Subjective: Patient seen - continues to feel contractions.  Fever improved with tylenol and antibiotics.  Objective: BP 120/62  Pulse 112  Temp(Src) 101 F (38.3 C) (Oral)  Resp 20  Ht 5\' 2"  (1.575 m)  Wt 78.926 kg (174 lb)  BMI 31.83 kg/m2  SpO2 100%      FHT:  FHR: 140-150 bpm, variability: moderate,  accelerations:  Present,  decelerations:  Absent UC:   regular, every 3 minutes SVE:   Dilation: 6 Effacement (%): 50 Station: -2 Exam by:: dr Adrian Blackwater  Labs: Lab Results  Component Value Date   WBC 6.9 10/29/2010   HGB 10.7* 10/29/2010   HCT 30.9* 10/29/2010   MCV 79.4 10/29/2010   PLT 191 10/29/2010    Assessment / Plan: Arrest of decent  Labor: Arrest of decent.  Will procede to c-section. Fetal Wellbeing:  Category I Pain Control:  Epidural I/D:  Chorioamnioitis - Ampicillin and Gentamycin Anticipated MOD:  will procede to c-section.  Jasmine Silva JEHIEL 10/30/2010, 2:29 PM

## 2010-10-30 NOTE — Progress Notes (Signed)
Jasmine Silva is a 25 y.o. G2P1001 at [redacted]w[redacted]d  Subjective: Resting comfortably after epidural. Pain well-controlled. Only slightly aware of contractions. No other complaints.  Objective: BP 122/81  Pulse 95  Temp(Src) 98.5 F (36.9 C) (Oral)  Resp 18  Ht 5\' 2"  (1.575 m)  Wt 174 lb (78.926 kg)  BMI 31.83 kg/m2  SpO2 100%  LMP 10/21/2010      FHT:  FHR: 150 bpm, variability: moderate,  accelerations:  Present,  decelerations:  Absent UC:   regular, every 2-4 minutes SVE:   Dilation: 5.5 Effacement (%): 70 Station: -3 Exam by:: [employer] RN  Labs: Lab Results  Component Value Date   WBC 6.9 10/29/2010   HGB 10.7* 10/29/2010   HCT 30.9* 10/29/2010   MCV 79.4 10/29/2010   PLT 191 10/29/2010    Assessment / Plan: Labor, progressing with pitocin  Fetal Wellbeing:  Category I Pain Control:  Epidural I/D:  n/a Anticipated MOD:  NSVD  Queena Monrreal, [name] 10/30/2010, 6:57 AM

## 2010-10-30 NOTE — Progress Notes (Signed)
Jasmine Silva is a 25 y.o. G2P1001 at [redacted]w[redacted]d Subjective: Contractions more intense. Declined pain medications at this time. Continues to leak fluid. No other complaints  Objective: BP 128/67  Pulse 89  Temp(Src) 98.3 F (36.8 C) (Oral)  Resp 18  Ht 5\' 2"  (1.575 m)  Wt 174 lb (78.926 kg)  BMI 31.83 kg/m2  LMP 10/21/2010      FHT:  FHR: 130 bpm, variability: moderate,  accelerations:  Present,  decelerations:  Absent UC:   regular, every 2-4 minutes SVE:   Dilation: 6 Effacement (%): 70 Station: -2 Exam by::  (Nadiyah Zeis MD)  Labs: Lab Results  Component Value Date   WBC 6.9 10/29/2010   HGB 10.7* 10/29/2010   HCT 30.9* 10/29/2010   MCV 79.4 10/29/2010   PLT 191 10/29/2010    Assessment / Plan: Labor - no progress since last check. Continue to titrate pitocin  Labor: Augmenting with pitocin Fetal Wellbeing:  Category I Pain Control:  PO/IV meds Anticipated MOD:  NSVD  Emila Steinhauser, [name] 10/30/2010, 3:37 AM

## 2010-10-30 NOTE — Progress Notes (Signed)
  Dr. Denyce Robert note reviewed and I examined and counseled the patient. I offered cesarean section due to arrest of active phase of labor. The risks of anesthesia, bleeding, infection, bowel and urinary tract damage was explained and her questions were answered. The procedure will follow currently posted surgery.

## 2010-10-30 NOTE — OR Nursing (Signed)
Uterus massaged by S. Delita Chiquito RN. Two tubes of cord blood sent to lab.  

## 2010-10-30 NOTE — Transfer of Care (Signed)
Immediate Anesthesia Transfer of Care Note  Patient: Jasmine Silva  Procedure(s) Performed:  CESAREAN SECTION - Primary cesarean section with delivery of baby girl at 14. Apgars 8/9.  Patient Location: PACU  Anesthesia Type: Epidural  Level of Consciousness: awake, alert  and oriented  Airway & Oxygen Therapy: Patient Spontanous Breathing  Post-op Assessment: Report given to PACU RN and Post -op Vital signs reviewed and stable  Post vital signs: stable  Complications: No apparent anesthesia complications

## 2010-10-30 NOTE — Progress Notes (Signed)
  Subjective: Patient with intrapartum fever.  Epidural effective.  No complaints.  Objective: BP 116/53  Pulse 106  Temp(Src) 101.3 F (38.5 C) (Oral)  Resp 18  Ht 5\' 2"  (1.575 m)  Wt 78.926 kg (174 lb)  BMI 31.83 kg/m2  SpO2 100%      FHT:  FHR: 160s bpm, variability: minimal ,  accelerations:  Present,  decelerations:  Absent UC:   irregular, every 2-6 minutes SVE:   Dilation: 6 Effacement (%): 50 Station: -2 Exam by:: dr Adrian Blackwater  Labs: Lab Results  Component Value Date   WBC 6.9 10/29/2010   HGB 10.7* 10/29/2010   HCT 30.9* 10/29/2010   MCV 79.4 10/29/2010   PLT 191 10/29/2010    Assessment / Plan: Arrest in active phase of labor  Labor: Will hold pitocin for 30 minutes, then restart at 10 milliunits. Fetal Wellbeing:  Category II Pain Control:  Epidural I/D:  will start ampicillin 2g q6 hours and gentamycin. Anticipated MOD:  NSVD  STINSON, JACOB JEHIEL 10/30/2010, 11:01 AM

## 2010-10-30 NOTE — Anesthesia Preprocedure Evaluation (Addendum)
Anesthesia Evaluation  Name, MR# and DOB Patient awake  General Assessment Comment  Reviewed: Allergy & Precautions, H&P , NPO status , Patient's Chart, lab work & pertinent test results  Airway Mallampati: II TM Distance: >3 FB Neck ROM: full    Dental No notable dental hx. (+) Teeth Intact   Pulmonary  clear to auscultation  pulmonary exam normalPulmonary Exam Normal breath sounds clear to auscultation none    Cardiovascular     Neuro/Psych Negative Neurological ROS  Negative Psych ROS  GI/Hepatic/Renal negative GI ROS  negative Liver ROS  negative Renal ROS        Endo/Other  Negative Endocrine ROS (+)      Abdominal Normal abdominal exam  (+)   Musculoskeletal negative musculoskeletal ROS (+)   Hematology negative hematology ROS (+)   Peds  Reproductive/Obstetrics (+) Pregnancy    Anesthesia Other Findings             Anesthesia Physical Anesthesia Plan  ASA: II and Emergent  Anesthesia Plan: Epidural   Post-op Pain Management:    Induction:   Airway Management Planned:   Additional Equipment:   Intra-op Plan:   Post-operative Plan:   Informed Consent: I have reviewed the patients History and Physical, chart, labs and discussed the procedure including the risks, benefits and alternatives for the proposed anesthesia with the patient or authorized representative who has indicated his/her understanding and acceptance.     Plan Discussed with:   Anesthesia Plan Comments:        Anesthesia Quick Evaluation

## 2010-10-30 NOTE — Anesthesia Procedure Notes (Signed)
Epidural Patient location during procedure: OB Start time: 10/30/2010 6:00 AM End time: 10/30/2010 6:08 AM Reason for block: procedure for pain  Staffing Anesthesiologist: Sandrea Hughs Performed by: anesthesiologist   Preanesthetic Checklist Completed: patient identified, site marked, surgical consent, pre-op evaluation, timeout performed, IV checked, risks and benefits discussed and monitors and equipment checked  Epidural Patient position: sitting Prep: site prepped and draped and DuraPrep Patient monitoring: continuous pulse ox and blood pressure Approach: midline Injection technique: LOR air  Needle:  Needle type: Tuohy  Needle gauge: 17 G Needle length: 9 cm Needle insertion depth: 4 cm Catheter type: closed end flexible Catheter size: 19 Gauge Catheter at skin depth: 9 cm Test dose: negative and 1.5% lidocaine  Assessment Sensory level: T8 Events: blood not aspirated, injection not painful, no injection resistance, negative IV test and no paresthesia

## 2010-10-30 NOTE — Progress Notes (Signed)
Jasmine Silva is a 25 y.o. G2P1001 at [redacted]w[redacted]d Subjective: Contractions strengthening; reports gush of fluid @0000 . No other complaints or concerns. Does not want epidural.  Objective: BP 107/61  Pulse 87  Temp(Src) 98.4 F (36.9 C) (Oral)  Resp 18  Ht 5\' 2"  (1.575 m)  Wt 174 lb (78.926 kg)  BMI 31.83 kg/m2  LMP 10/21/2010      FHT:  FHR: 130 bpm, variability: moderate,  accelerations:  Present,  decelerations:  Absent UC:   regular, every 2-4 minutes SVE:   Dilation: 5 Effacement (%): 50 Station: -3 Exam by::  (Ajani Schnieders MD)  Labs: Lab Results  Component Value Date   WBC 6.9 10/29/2010   HGB 10.7* 10/29/2010   HCT 30.9* 10/29/2010   MCV 79.4 10/29/2010   PLT 191 10/29/2010    Assessment / Plan: Labor progressing with pitocin  Labor: Progressing with pit, SROM @0000  Fetal Wellbeing:  Category I Pain Control:  PO/IV meds Anticipated MOD:  NSVD  [name], [name] 10/30/2010, 12:26 AM

## 2010-10-30 NOTE — Anesthesia Postprocedure Evaluation (Signed)
  Anesthesia Post-op Note  Patient: Jasmine Silva  Procedure(s) Performed:  CESAREAN SECTION - Primary cesarean section with delivery of baby girl at 78. Apgars 8/9.  Patient Location: PACU  Anesthesia Type: Epidural  Level of Consciousness: awake, alert  and oriented  Airway and Oxygen Therapy: Patient Spontanous Breathing  Post-op Pain: none  Post-op Assessment: Post-op Vital signs reviewed, Patient's Cardiovascular Status Stable, Respiratory Function Stable, Patent Airway, No signs of Nausea or vomiting, Pain level controlled, No headache, No backache, No residual numbness and No residual motor weakness  Post-op Vital Signs: Reviewed and stable  Complications: No apparent anesthesia complications

## 2010-10-30 NOTE — Progress Notes (Signed)
Updated about SVE, UC's, and Pitocin. MD aware

## 2010-10-30 NOTE — Op Note (Signed)
Jasmine Silva PROCEDURE DATE: 10/30/2010  PREOPERATIVE DIAGNOSIS: Intrauterine pregnancy at  [redacted]w[redacted]d weeks gestation; failure to progress: arrest of dilation, chorioamnionitis  POSTOPERATIVE DIAGNOSIS: The same  PROCEDURE:     Cesarean Section  SURGEON:  Dr. Marcie Bal   ASSISTANT: Dr Candelaria Celeste  INDICATIONS: Jasmine Silva is a 25 y.o. [id number] at [redacted]w[redacted]d scheduled for cesarean section secondary to failure to progress: arrest of dilation.  The risks of cesarean section discussed with the patient included but were not limited to: bleeding which may require transfusion or reoperation; infection which may require antibiotics; injury to bowel, bladder, ureters or other surrounding organs; injury to the fetus; need for additional procedures including hysterectomy in the event of a life-threatening hemorrhage; placental abnormalities wth subsequent pregnancies, incisional problems, thromboembolic phenomenon and other postoperative/anesthesia complications. The patient concurred with the proposed plan, giving informed written consent for the procedure.    FINDINGS:  Viable female infant in cephalic presentation.  Apgars 8 and 9, weight, 8 pounds and 11 ounces.  Clear amniotic fluid.  Intact placenta, three vessel cord.  Normal uterus, fallopian tubes and ovaries bilaterally.  ANESTHESIA:    Spinal INTRAVENOUS FLUIDS:2300 ml ESTIMATED BLOOD LOSS: 800 ml URINE OUTPUT:  200 ml SPECIMENS: Placenta sent to pathology/L&D COMPLICATIONS: None immediate  PROCEDURE IN DETAIL:  The patient received intravenous antibiotics and had sequential compression devices applied to her lower extremities while in the preoperative area.  She was then taken to the operating room where epidural anesthesia was dosed up to surgical level and was found to be adequate. She was then placed in a dorsal supine position with a leftward tilt, and prepped and draped in a sterile manner.  A foley catheter was placed into her bladder and  attached to constant gravity.  After an adequate timeout was performed, a Pfannenstiel skin incision was made with scalpel and carried through to the underlying layer of fascia. The fascia was incised in the midline and this incision was extended bilaterally using the Mayo scissors. Kocher clamps were applied to the superior aspect of the fascial incision and the underlying rectus muscles were dissected off bluntly. A similar process was carried out on the inferior aspect of the facial incision. The rectus muscles were separated in the midline bluntly and the peritoneum was entered bluntly. Attention was turned to the lower uterine segment where a transverse hysterotomy was made with a scalpel and extended bilaterally bluntly. The bladder blade was then removed. The infant was successfully delivered, and cord was clamped and cut and infant was handed over to awaiting neonatology team. Uterine massage was then administered and the placenta delivered intact with three-vessel cord. The uterus was cleared of clot and debris.  The hysterotomy was closed with 0 Vicryl in a running locked fashion, and an imbricating layer was also placed with a 0 Vicryl. Overall, excellent hemostasis was noted. The abdomen and the pelvis were cleared of all clot and debris. Hemostasis was confirmed on all surfaces.  The peritoneum were reapproximated using 0 vicryl running stitches. The fascia was then closed using 0 Vicryl in a running fashion.  The skin was closed with a running subcuticular fashion using 4-0 vicryl. The patient tolerated the procedure well. Sponge, lap, instrument and needle counts were correct x 2. She was taken to the recovery room in stable condition.   Umbilical artery pH was 7.31.  Candelaria Celeste JEHIEL  10/30/2010 5:54 PM

## 2010-10-30 NOTE — Consult Note (Signed)
ANTIBIOTIC CONSULT NOTE - INITIAL  Pharmacy Consult for gentamicin Indication: maternal fever  No Known Allergies  Patient Measurements: Height: 5\' 2"  (157.5 cm) Weight: 174 lb (78.926 kg) IBW/kg (Calculated) : 50.1  Adjusted Body Weight: 58.9kg  Vital Signs: Temp: 101.3 F (38.5 C) (09/08 1001) Temp src: Oral (09/08 1001) BP: 116/53 mmHg (09/08 1031) Pulse Rate: 106  (09/08 1031)    Labs:  Basename 10/29/10 0950  WBC 6.9  HGB 10.7*  PLT 191  LABCREA --  CREATININE --  CRCLEARANCE --    Microbiology: Recent Results (from the past 720 hour(s))  STREP B DNA PROBE     Status: Normal      Component Value Range Status Comment   Group B Strep Ag Negative      CULTURE, BETA STREP (GROUP B ONLY)     Status: Normal   Collection Time   10/21/10  9:43 PM      Component Value Range Status Comment   Specimen Description VAGINAL/RECTAL   Final    Special Requests NONE   Final    Culture NO GROUP B STREP (S.AGALACTIAE) ISOLATED   Final    Report Status 10/24/2010 FINAL   Final     Medications:  Ampicillin 2 grams IV every 6 hours.  Assessment: Patient is 49 1/7weeks admitted for IOL now with maternal fever.  Will start gentamicin.   Goal of Therapy:  Gentamicin trough level <2 mcg/ml and peak of 6-8 mcg/ml.  Plan:  Gentamicin 160mg  IV load followed by a maintenance dose of 150mg  IV every 8 hours.  Will check serum creatinine if continued post partum.  Will check gentamicin levels if continued greater than 72 hours or as clinically indicated.  Berlin Hun D 10/30/2010,10:58 AM

## 2010-10-31 LAB — CBC
HCT: 22.8 % — ABNORMAL LOW (ref 36.0–46.0)
MCH: 28.5 pg (ref 26.0–34.0)
MCHC: 36 g/dL (ref 30.0–36.0)
MCV: 79.2 fL (ref 78.0–100.0)
RDW: 14.1 % (ref 11.5–15.5)
WBC: 12.6 10*3/uL — ABNORMAL HIGH (ref 4.0–10.5)

## 2010-10-31 LAB — CREATININE, SERUM: GFR calc Af Amer: >60 mL/min (ref >60)

## 2010-10-31 NOTE — Progress Notes (Signed)
POD#1, pLCTS Subjective: no complaints, up ad lib, voiding, tolerating PO and + flatus  Objective: Blood pressure 99/66, pulse 98, temperature 97.9 F (36.6 C), temperature source Axillary, resp. rate 20, height 5\' 2"  (1.575 m), weight 174 lb (78.926 kg), last menstrual period 10/21/2010, SpO2 98.00%, unknown if currently breastfeeding.  Physical Exam:  General: alert and cooperative Lochia: appropriate Uterine Fundus: firm Incision: dressing c/d/i DVT Evaluation: No evidence of DVT seen on physical exam. SCDs in place   [facility] 10/31/10 0614 10/29/10 0950  HGB 8.2* 10.7*  HCT 22.8* 30.9*    Assessment/Plan: Breastfeeding and Contraception undecided; likely d/c on tuesday   LOS: 2 days   Vasili Fok, Raina Mina 10/31/2010, 7:41 AM

## 2010-10-31 NOTE — Progress Notes (Signed)
Encounter addended by: Madison Hickman on: 10/31/2010 12:39 PM<BR>     Documentation filed: Notes Section

## 2010-10-31 NOTE — Anesthesia Postprocedure Evaluation (Signed)
  Anesthesia Post-op Note  Patient: Jasmine Silva  Procedure(s) Performed:  CESAREAN SECTION - Primary cesarean section with delivery of baby girl at 3. Apgars 8/9.  Patient Location: mother baby  Anesthesia Type: Spinal  Level of Consciousness: awake, alert  and oriented  Airway and Oxygen Therapy: Patient Spontanous Breathing  Post-op Pain: none  Post-op Assessment: Post-op Vital signs reviewed and Patient's Cardiovascular Status Stable  Post-op Vital Signs: Reviewed and stable  Complications: No apparent anesthesia complications

## 2010-10-31 NOTE — Consult Note (Signed)
Pharmacy note: gentamicin  Serum creatinine this morning was 0.59mg /dL.  Continue current gentamicin regimen.  Berlin Hun, Pharm.D.

## 2010-11-01 MED ORDER — FERROUS SULFATE 325 (65 FE) MG PO TABS
325.0000 mg | ORAL_TABLET | Freq: Three times a day (TID) | ORAL | Status: DC
  Administered 2010-11-01 – 2010-11-02 (×4): 325 mg via ORAL
  Filled 2010-11-01 (×4): qty 1

## 2010-11-01 NOTE — Progress Notes (Signed)
  Subjective: Postpartum Day 2: PLTCS Patient reports + flatus, + BM and no problems voiding.  No complaints; pain well controlled.  Breast feeding, concerns about amount of milk; unsure of birth control.  Objective: Vital signs in last 24 hours: Temp:  [97.3 F (36.3 C)-98.3 F (36.8 C)] 97.7 F (36.5 C) (09/10 0505) Pulse Rate:  [91-99] 91  (09/10 0505) Resp:  [18-20] 18  (09/10 0505) BP: (99-119)/(63-71) 107/71 mmHg (09/10 0505) SpO2:  [98 %-100 %] 100 % (09/09 1720)  Physical Exam:  General: alert, cooperative, appears stated age and no distress Lochia: appropriate Uterine Fundus: firm Incision: dressing c/d/i DVT Evaluation: No evidence of DVT seen on physical exam. Negative Homan's sign. No significant calf/ankle edema.   Basename 10/31/10 0614 10/29/10 0950  HGB 8.2* 10.7*  HCT 22.8* 30.9*    Assessment/Plan: Status post Cesarean section. Doing well postoperatively.  Continue current care.  Lactation consult.  Will add iron supplement.  Discharge tomorrow.  BOOTH, Kamoni Depree 11/01/2010, 7:41 AM

## 2010-11-01 NOTE — Progress Notes (Signed)
UR chart review completed.  

## 2010-11-01 NOTE — Progress Notes (Signed)
PSYCHOSOCIAL ASSESSMENT ~ MATERNAL/CHILD  Name: Carrolyn Meiers Age: 25  Referral Date: 09 /10 / 12  Reason/Source: NPNC / CN  I. FAMILY/HOME ENVIRONMENT  A. Child's Legal Guardian _X__Parent(s) ___Grandparent ___Foster parent ___DSS_________________  Name: Vista Mink DOB: // Age: 68  Address: [address] ; [place], [place] [address]  Name: Kateri Mc DOB: // Age: 27  Address: [place]  B. Other Household Members/Support Persons Name: Mervin Hack Relationship: mother DOB ___/___/___  Name: Relationship: DOB ___/___/___  Name: Relationship: DOB ___/___/___  Name: Relationship: DOB ___/___/___  C. Other Support:  II. PSYCHOSOCIAL DATA A. Information Source _X_Patient Interview __Family Interview __Other___________ B. Event organiser __Employment:  __Medicaid [place]: __Private Insurance: _X_Self Pay  __Food Stamps __WIC __Work First __Public Housing __Section 8  __Maternity Care Coordination/Child Service Coordination/Early Intervention  ___School: Grade:  __Other:  Salena Saner Cultural and Environment Information Cultural Issues Impacting Care:  III. STRENGTHS _X__Supportive family/friends  _X__Adequate Resources  ___Compliance with medical plan  __X_Home prepared for Child (including basic supplies)  ___Understanding of illness  ___Other:  RISK FACTORS AND CURRENT PROBLEMS ____No Problems Noted  NPNC  IV. SOCIAL WORK ASSESSMENT Pt came from [place] on September 06, 2010. According to pt, she received PNC in [place]. She had 2 appointments in the clinic here at the hospital prior to delivery. Pt lives with her mother. FOB is in [place]. She reports having most supplies for the infant but express need for more clothes. Sw provided pt with a bundle pack of clothes. She denies illegal substance use. UDS is negative and Meconium is pending. Sw will follow up with drug screen results and make a referral if needed.  V. SOCIAL WORK PLAN _X__No Further Intervention Required/No  Barriers to Discharge  ___Psychosocial Support and Ongoing Assessment of Needs  ___Patient/Family Education:  ___Child Protective Services Report County___________ Date___/____/____  ___Information/Referral to [employer]_________________________  ___Other:

## 2010-11-02 MED ORDER — FERROUS SULFATE 325 (65 FE) MG PO TABS: 325.0000 mg | ORAL_TABLET | Freq: Three times a day (TID) | ORAL | Status: DC

## 2010-11-02 MED ORDER — IBUPROFEN 600 MG PO TABS: 600.0000 mg | ORAL_TABLET | Freq: Four times a day (QID) | ORAL | Status: AC | PRN

## 2010-11-02 MED ORDER — OXYCODONE-ACETAMINOPHEN 5-325 MG PO TABS: 1.0000 | ORAL_TABLET | ORAL | Status: AC | PRN

## 2010-11-02 MED ORDER — DOCUSATE SODIUM 100 MG PO CAPS: 100.0000 mg | ORAL_CAPSULE | Freq: Two times a day (BID) | ORAL | Status: AC

## 2010-11-02 MED ORDER — ACETAMINOPHEN 325 MG PO TABS: 650.0000 mg | ORAL_TABLET | Freq: Four times a day (QID) | ORAL | Status: AC | PRN

## 2010-11-02 MED ORDER — SIMETHICONE 80 MG PO CHEW: 80.0000 mg | CHEWABLE_TABLET | Freq: Three times a day (TID) | ORAL | Status: AC

## 2010-11-02 NOTE — Progress Notes (Signed)
Post Partum Day #3 Subjective: no complaints, up ad lib, voiding, tolerating PO and +BM  Objective: Blood pressure 101/63, pulse 87, temperature 98 F (36.7 C), temperature source Oral, resp. rate 18, height 5\' 2"  (1.575 m), weight 174 lb (78.926 kg), last menstrual period 10/21/2010, SpO2 100.00%, unknown if currently breastfeeding.  Physical Exam:  General: alert and cooperative Lochia: appropriate Uterine Fundus: firm Incision: healing well, no dehiscence, no significant erythema DVT Evaluation: No evidence of DVT seen on physical exam.   Basename 10/31/10 0614  HGB 8.2*  HCT 22.8*    Assessment/Plan: Discharge home and Breastfeeding   LOS: 4 days   Jasmine Silva, [name] 11/02/2010, 6:59 AM

## 2010-11-02 NOTE — Discharge Summary (Signed)
Obstetric Discharge Summary Reason for Admission: onset of labor Prenatal Procedures: NST and ultrasound Intrapartum Procedures: cesarean: low cervical, transverse Postpartum Procedures: Developed chorio intrapartum Complications-Operative and Postpartum: none Hemoglobin  Date Value Range Status  10/31/2010 8.2* 12.0-15.0 (g/dL) Final     DELTA CHECK NOTED     REPEATED TO VERIFY     HCT  Date Value Range Status  10/31/2010 22.8* 36.0-46.0 (%) Final    Discharge Diagnoses: Term Pregnancy-delivered  Discharge Information: Date: 11/02/2010 Activity: pelvic rest Diet: routine Medications: PNV, Tylenol #3, Ibuprophen and Colace Condition: stable Instructions: refer to practice specific booklet Discharge to: home Follow-up Information    Please follow up. (Please call and make appointment in 4-6wks)          Newborn Data: Live born female  Birth Weight: 8 lb 11 oz (3940 g) APGAR: 8, 9  Home with mother.  Brode Sculley, [name] 11/02/2010, 7:02 AM

## 2010-11-03 ENCOUNTER — Encounter (HOSPITAL_COMMUNITY): Payer: Self-pay | Admitting: Obstetrics & Gynecology

## 2010-12-08 ENCOUNTER — Ambulatory Visit (INDEPENDENT_AMBULATORY_CARE_PROVIDER_SITE_OTHER): Payer: Medicaid Other | Admitting: Obstetrics and Gynecology

## 2010-12-08 ENCOUNTER — Encounter: Payer: Self-pay | Admitting: Obstetrics and Gynecology

## 2010-12-08 NOTE — Progress Notes (Signed)
Subjective:     Patient ID: Jasmine Silva, female   DOB: 06-14-1985, 25 y.o.   MRN: [id number]  HPI this patient is a 25 year old gravida 2 para 2002. Recently arrived in the [place] from [place] and [place]. She had no prenatal care showed up in labor in the [facility]. She underwent cesarean section for failure to progress. That was on September 9 and she was discharged on September 11. Her hemoglobin on 97 was 10.7 and 99 was 8.2. She is nursing and has no complaints. She has not decided on contraception as yet. We've discussed in detail the different types of contraception and I've asked her to return when she decides.  Review of Systems view of systems is negative with exception of present illness is slight tenderness around the umbilicus.     Objective:   Physical Exam exam blood pressure is 108/69. HEENT within normal limits. Neck supple thyroid symmetrical without masses. Lungs clear to auscultation percussion. Heart no murmur normal sinus rhythm. Breasts lactating no masses. Abdomen soft flat with a persistent diastases recti wound well healed. Genitalia external normal BUS within normal limits vagina clean and well rugated cervix clean and parous. Uterus well involuted adnexa normal.     Assessment:    normal postpartum exam patient with no complaints not yet decided on contraception para    Plan:    Asian to return when she decides on contraception.

## 2010-12-20 ENCOUNTER — Encounter: Payer: Self-pay | Admitting: *Deleted

## 2011-07-08 ENCOUNTER — Other Ambulatory Visit: Payer: Self-pay | Admitting: Infectious Diseases

## 2011-07-08 ENCOUNTER — Ambulatory Visit
Admission: RE | Admit: 2011-07-08 | Discharge: 2011-07-08 | Disposition: A | Discharge status: Home / Self Care | Payer: No Typology Code available for payment source | Source: Ambulatory Visit | Attending: Infectious Diseases | Admitting: Infectious Diseases

## 2011-07-08 DIAGNOSIS — R7611 Nonspecific reaction to tuberculin skin test without active tuberculosis: Secondary | ICD-10-CM

## 2012-12-30 IMAGING — CR DG CHEST 1V
1 series · 1 of 1 positions shown · non-contrast
Comparison: None.

CLINICAL DATA: Positive PPD.  No current chest complaints

CHEST - 1 VIEW

[view not recorded]
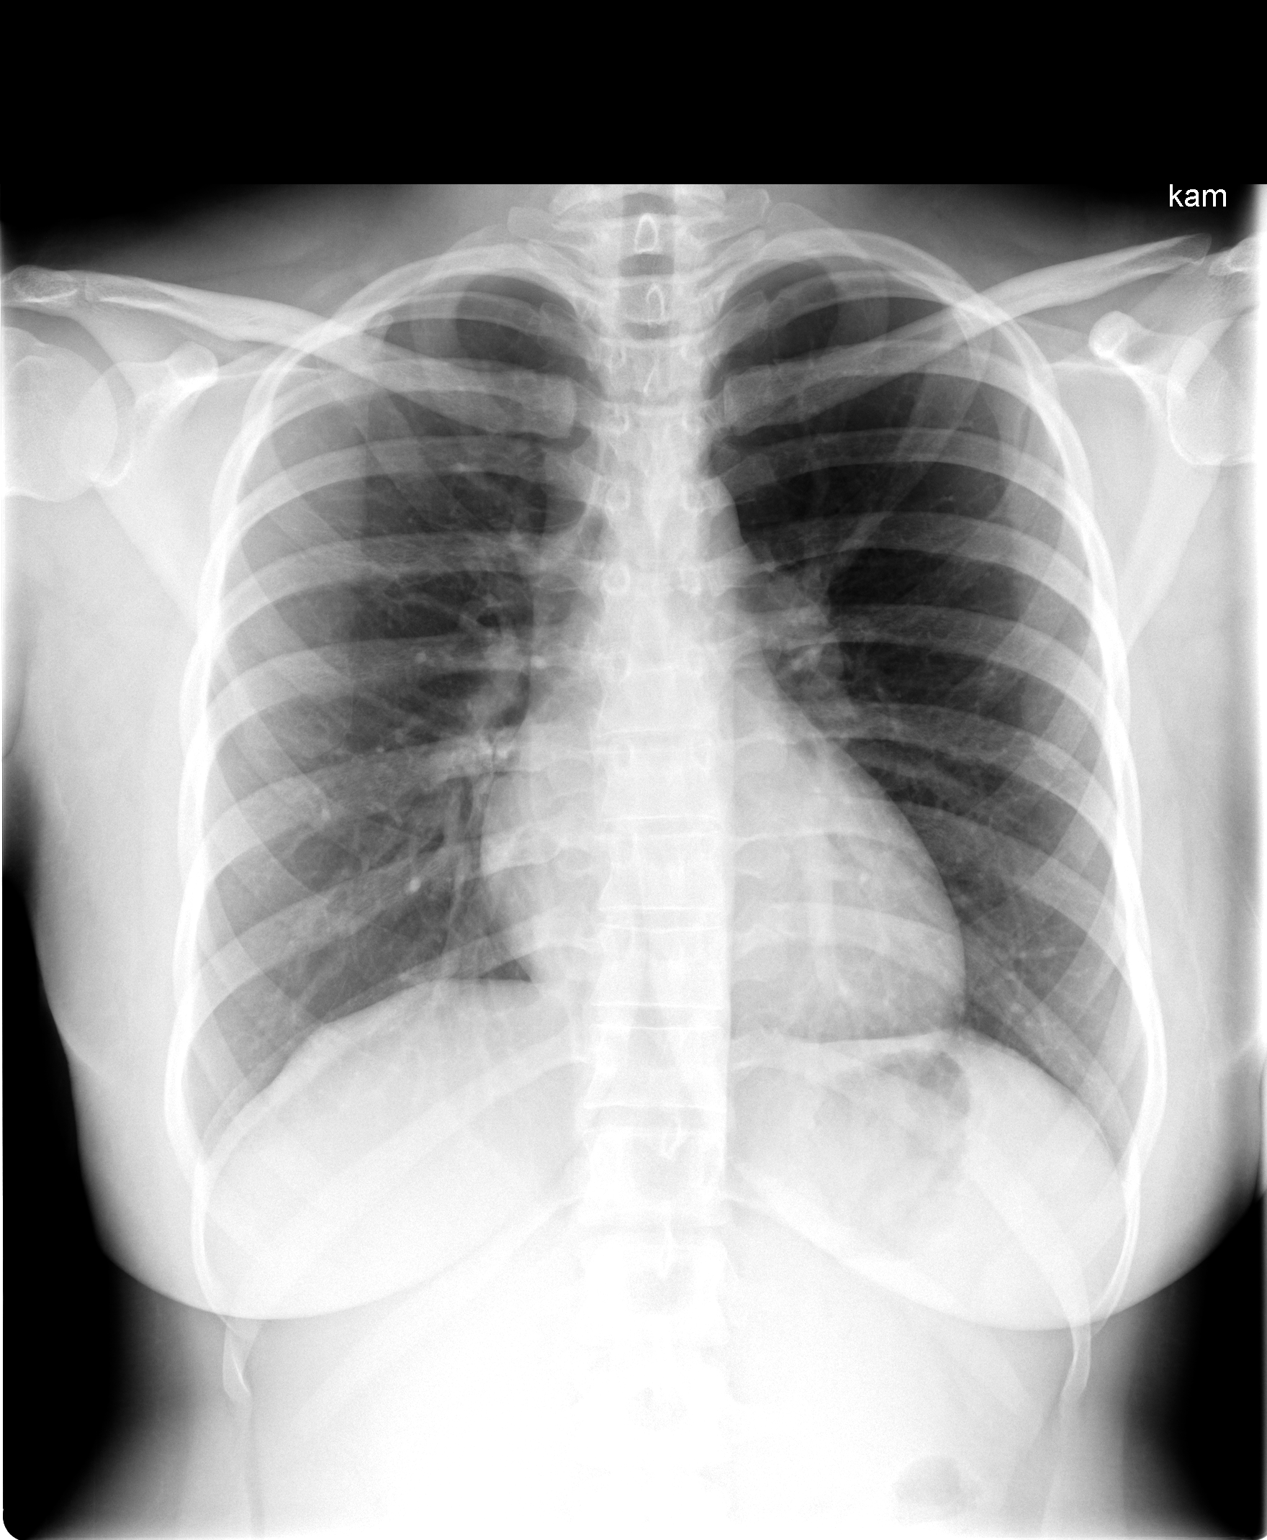

[1 of 1 positions shown; findings below may reference images not displayed]

FINDINGS: Heart and mediastinal contours are within normal limits.
The lung fields appear clear with no signs of focal infiltrate or
congestive failure.  No pleural fluid or significant peribronchial
cuffing is noted.

Bony structures appear intact.
IMPRESSION: No worrisome focal or acute cardiopulmonary abnormality identified.
Specifically no radiographic stigmata of tuberculosis are seen.

## 2013-12-23 ENCOUNTER — Encounter: Payer: Self-pay | Admitting: Obstetrics and Gynecology

## 2014-08-24 ENCOUNTER — Encounter (HOSPITAL_COMMUNITY): Payer: Self-pay | Admitting: Emergency Medicine

## 2014-08-24 ENCOUNTER — Emergency Department (HOSPITAL_COMMUNITY)
Admission: EM | Admit: 2014-08-24 | Discharge: 2014-08-24 | Discharge status: Absent without leave | Payer: Self-pay | Source: Home / Self Care

## 2014-08-24 ENCOUNTER — Emergency Department (HOSPITAL_COMMUNITY)
Admission: EM | Admit: 2014-08-24 | Discharge: 2014-08-25 | Disposition: A | Discharge status: Home / Self Care | Payer: Self-pay | Attending: Emergency Medicine | Admitting: Emergency Medicine

## 2014-08-24 DIAGNOSIS — Z98811 Dental restoration status: Secondary | ICD-10-CM | POA: Insufficient documentation

## 2014-08-24 DIAGNOSIS — D649 Anemia, unspecified: Secondary | ICD-10-CM | POA: Insufficient documentation

## 2014-08-24 DIAGNOSIS — K0889 Other specified disorders of teeth and supporting structures: Secondary | ICD-10-CM

## 2014-08-24 DIAGNOSIS — Z79899 Other long term (current) drug therapy: Secondary | ICD-10-CM | POA: Insufficient documentation

## 2014-08-24 DIAGNOSIS — K088 Other specified disorders of teeth and supporting structures: Secondary | ICD-10-CM | POA: Insufficient documentation

## 2014-08-24 MED ORDER — KETOROLAC TROMETHAMINE 60 MG/2ML IM SOLN
60.0000 mg | Freq: Once | INTRAMUSCULAR | Status: AC
  Administered 2014-08-24: 60 mg via INTRAMUSCULAR
  Filled 2014-08-24: qty 2

## 2014-08-24 MED ORDER — BUPIVACAINE-EPINEPHRINE (PF) 0.5% -1:200000 IJ SOLN
1.8000 mL | Freq: Once | INTRAMUSCULAR | Status: AC
  Administered 2014-08-24: 1.8 mL
  Filled 2014-08-24: qty 1.8

## 2014-08-24 MED ORDER — PENICILLIN V POTASSIUM 250 MG PO TABS
500.0000 mg | ORAL_TABLET | Freq: Once | ORAL | Status: AC
Start: 2014-08-24 — End: 2014-08-24
  Administered 2014-08-24: 500 mg via ORAL
  Filled 2014-08-24: qty 2

## 2014-08-24 NOTE — ED Notes (Signed)
Pt. reports right lower molar pain onset this week .

## 2014-08-24 NOTE — ED Provider Notes (Signed)
CSN: [id number]     Arrival date & time 08/24/14  2145 History   This chart was scribed for non-physician practitioner, Danelle Berry, PA-C, working with Tilden Fossa, MD, by Ronney Lion, ED Scribe. This patient was seen in room TR08C/TR08C and the patient's care was started at 10:56 PM.    Chief Complaint  Patient presents with  . Dental Pain   The history is provided by the patient. No language interpreter was used.   HPI Comments: Jasmine Silva is a 29 y.o. female who presents to the Emergency Department complaining of constant, severe, right lower molar pain radiating to her right ear that began one week ago and acutely worsened today. She reports her filling fell out of her tooth 2 weeks ago. She also complains of having difficulty sleeping last night secondary to the pain. Talking and jaw movement exacerbate her pain. She denies receiving any prior medical attention or seeing a dentist for this. Patient has taken ibuprofen for the pain with minimal relief. She denies any fever, chills, sweats, N/V, difficulty swallowing, breathing, talking.  She has not swelling of her face or cheek, just extreme and unbearable pain. f   Past Medical History  Diagnosis Date  . No pertinent past medical history   . Anemia    Past Surgical History  Procedure Laterality Date  . No past surgeries    . Cesarean section  10/30/2010    Procedure: CESAREAN SECTION;  Surgeon: Scheryl Darter, MD;  Location: [facility];  Service: Gynecology;  Laterality: N/A;  Primary cesarean section with delivery of baby girl at 32. Apgars 8/9.   No family history on file. History  Substance Use Topics  . Smoking status: Never Smoker   . Smokeless tobacco: Never Used  . Alcohol Use: No   OB History    Gravida Para Term Preterm AB TAB SAB Ectopic Multiple Living   0 0 0 0 0 0 2     Review of Systems  Constitutional: Negative for fever.  HENT: Positive for dental problem.   Gastrointestinal: Negative for vomiting.    Allergies  Review of patient's allergies indicates no known allergies.  Home Medications   Prior to Admission medications   Medication Sig Start Date End Date Taking? Authorizing Provider  ferrous sulfate 325 (65 FE) MG tablet Take 1 tablet (325 mg total) by mouth 3 (three) times daily with meals. 11/02/10 11/02/11  Lutricia Feil, MD  HYDROcodone-acetaminophen (NORCO/VICODIN) 5-325 MG per tablet Take 2 tablets by mouth every 4 (four) hours as needed. 08/25/14   Danelle Berry, PA-C  penicillin v potassium (VEETID) 500 MG tablet Take 1 tablet (500 mg total) by mouth 4 (four) times daily. 08/25/14   Danelle Berry, PA-C  prenatal vitamin w/FE, FA (PRENATAL 1 + 1) 27-1 MG TABS Take 1 tablet by mouth daily.      Historical Provider, MD   BP 112/71 mmHg  Pulse 69  Temp(Src) 97.6 F (36.4 C) (Oral)  Resp 16  SpO2 100% Physical Exam  Constitutional: She is oriented to person, place, and time. She appears well-developed and well-nourished. She appears distressed.  HENT:  Head: Normocephalic and atraumatic.  Right Ear: External ear normal.  Left Ear: External ear normal.  Nose: Nose normal.  Mouth/Throat: Oropharynx is clear and moist. Tongue is normal.    Eyes: Conjunctivae and EOM are normal. Pupils are equal, round, and reactive to light. Right eye exhibits no discharge. Left eye exhibits no discharge. No scleral icterus.  Neck: Trachea normal, normal range of motion and full passive range of motion without pain. Neck supple. No JVD present. No tracheal tenderness present. No rigidity. No tracheal deviation, no edema and no erythema present. No thyroid mass present.  Cardiovascular: Normal rate.   Pulmonary/Chest: Effort normal. No stridor. No respiratory distress.  Musculoskeletal: Normal range of motion.  Neurological: She is alert and oriented to person, place, and time.  Skin: Skin is warm and dry. She is not diaphoretic. No cyanosis. No pallor. Nails show no clubbing.  Psychiatric: She has a  normal mood and affect. Her behavior is normal.  Nursing note and vitals reviewed. Soft Tissue Exam Extraoral Exam Extraoral exam was normal. Findings were negative for: face, neck, head and lips.  Intraoral Exam Findings were negative for: labial mucosa abnormal, gingiva, buccal mucosa, tongue, floor, palate and oropharynx.        ED Course  Procedures (including critical care time)  DIAGNOSTIC STUDIES: Oxygen Saturation is 98% on RA, normal by my interpretation.    COORDINATION OF CARE: 10:58 PM - Discussed treatment plan with pt at bedside which includes dental block, Rx antibiotics and pain medication, and dental referral, and pt agreed to plan.   Zachery Conch.den  NERVE BLOCK/ Dental block  Date/Time: August 24, 2014 Performed by: Danelle BerryLeisa Delorus Langwell, PA-C Authorized by: Danelle BerryLeisa Karolynn Infantino, PA-C Consent: Verbal consent obtained. Risks and benefits: risks, benefits and alternatives were discussed Consent given by: patient Indications: pain relief Body area: face/mouth Laterality: right lower jaw, molar #31 Needle gauge: 27G Local anesthetic: bupivacaine  Anesthetic total: 1.738ml Outcome: pain improved Patient tolerance: Patient tolerated the procedure well with no immediate complications. Comments: Patient had complete relief of pain.   Labs Review Labs Reviewed - No data to display  Imaging Review No results found.   EKG Interpretation None        MDM   Final diagnoses:  Pain, dental   Pt with acute dental pain s/p filling falling out, suspect pt additionally cracked tooth, otherwise excellent dental health, no obvious decay or carries, no trismus, oral lesion, laceration, tonsillar abscess or sublingual tenderness. No neck pain. No difficulty breathing. Tolerating secretions well.  No gross abscess.  Exam unconcerning for Ludwig's angina or spread of infection.  Pt received oral narcotic pain meds with minimal improvement of pain.  She has consented to a dental block for temporary  pain relief.  Block performed by me, with complete relief of pain, pt tolerated procedure with no complications. Pt to D/C home with Rx for penicillin and pain medicine.  Urged patient to follow-up with dentist.  Dental resources given.  Work note given.    I personally performed the services described in this documentation, which was scribed in my presence. The recorded information has been reviewed and is accurate.     Danelle BerryLeisa Edvardo Honse, PA-C 08/27/14 0231  Tilden FossaElizabeth Rees, MD 09/02/14 [phone]

## 2014-08-25 MED ORDER — PENICILLIN V POTASSIUM 500 MG PO TABS: 500.0000 mg | ORAL_TABLET | Freq: Four times a day (QID) | ORAL | Status: DC

## 2014-08-25 MED ORDER — HYDROCODONE-ACETAMINOPHEN 5-325 MG PO TABS: 2.0000 | ORAL_TABLET | ORAL | Status: DC | PRN

## 2014-08-25 NOTE — Discharge Instructions (Signed)

## 2016-02-19 ENCOUNTER — Emergency Department (HOSPITAL_COMMUNITY): Admission: EM | Admit: 2016-02-19 | Discharge: 2016-02-19 | Discharge status: Against Medical Advice | Payer: Self-pay

## 2016-02-19 NOTE — ED Notes (Signed)
Attempted to call pt to be triaged, no response.

## 2016-02-19 NOTE — ED Notes (Signed)
Called pt to be triaged 2 additional time, with no response.

## 2016-02-19 NOTE — ED Notes (Signed)
Called for triage, no answer

## 2016-06-15 ENCOUNTER — Ambulatory Visit (HOSPITAL_COMMUNITY)
Admission: EM | Admit: 2016-06-15 | Discharge: 2016-06-15 | Disposition: A | Discharge status: Home / Self Care | Payer: 59 | Attending: Family Medicine | Admitting: Family Medicine

## 2016-06-15 ENCOUNTER — Encounter (HOSPITAL_COMMUNITY): Payer: Self-pay | Admitting: Emergency Medicine

## 2016-06-15 DIAGNOSIS — D509 Iron deficiency anemia, unspecified: Secondary | ICD-10-CM

## 2016-06-15 DIAGNOSIS — D508 Other iron deficiency anemias: Secondary | ICD-10-CM | POA: Insufficient documentation

## 2016-06-15 DIAGNOSIS — N390 Urinary tract infection, site not specified: Secondary | ICD-10-CM | POA: Diagnosis not present

## 2016-06-15 DIAGNOSIS — R509 Fever, unspecified: Secondary | ICD-10-CM | POA: Diagnosis not present

## 2016-06-15 DIAGNOSIS — R531 Weakness: Secondary | ICD-10-CM | POA: Diagnosis not present

## 2016-06-15 DIAGNOSIS — Z3202 Encounter for pregnancy test, result negative: Secondary | ICD-10-CM

## 2016-06-15 LAB — POCT URINALYSIS DIP (DEVICE)
Bilirubin Urine: NEGATIVE
Glucose, UA: NEGATIVE mg/dL
Ketones, ur: NEGATIVE mg/dL
Nitrite: NEGATIVE
Protein, ur: NEGATIVE mg/dL
SPECIFIC GRAVITY, URINE: 1.015 (ref 1.005–1.030)
UROBILINOGEN UA: 0.2 mg/dL (ref 0.0–1.0)
pH: 7 (ref 5.0–8.0)

## 2016-06-15 LAB — POCT I-STAT, CHEM 8
BUN: 7 mg/dL (ref 6–20)
CHLORIDE: 102 mmol/L (ref 101–111)
CREATININE: 0.6 mg/dL (ref 0.44–1.00)
Calcium, Ion: 1.22 mmol/L (ref 1.15–1.40)
Glucose, Bld: 122 mg/dL — ABNORMAL HIGH (ref 65–99)
HEMATOCRIT: 32 % — AB (ref 36.0–46.0)
Hemoglobin: 10.9 g/dL — ABNORMAL LOW (ref 12.0–15.0)
Potassium: 3.9 mmol/L (ref 3.5–5.1)
Sodium: 136 mmol/L (ref 135–145)
TCO2: 25 mmol/L (ref 0–100)

## 2016-06-15 LAB — POCT PREGNANCY, URINE: Preg Test, Ur: NEGATIVE

## 2016-06-15 MED ORDER — NITROFURANTOIN MACROCRYSTAL 100 MG PO CAPS: 100.0000 mg | ORAL_CAPSULE | Freq: Four times a day (QID) | ORAL | 0 refills | Status: DC

## 2016-06-15 MED FILL — NITROFURANTOIN MCR 100 MG C: 100 | 4 days supply | Qty: 14 | Fill #0

## 2016-06-15 NOTE — ED Provider Notes (Signed)
[facility]    CSN: [id number] Arrival date & time: 06/15/16  1043     History   Chief Complaint Chief Complaint  Patient presents with  . Fever    HPI Jasmine Silva is a 31 y.o. female.   Onset Thursday of chills, weakness, right side pain, denies burning with urination.  No cough, headache, recent travels, [place], diarrhea, abdominal pains, stiff neck, or sore throat.  Patient works in [employer].  She is originally from [place].      Past Medical History:  Diagnosis Date  . Anemia   . No pertinent past medical history     There are no active problems to display for this patient.   Past Surgical History:  Procedure Laterality Date  . CESAREAN SECTION  10/30/2010   Procedure: CESAREAN SECTION;  Surgeon: Scheryl Darter, MD;  Location: [facility];  Service: Gynecology;  Laterality: N/A;  Primary cesarean section with delivery of baby girl at 30. Apgars 8/9.  Marland Kitchen NO PAST SURGERIES      OB History    Gravida Para Term Preterm AB Living   0 0 2   SAB TAB Ectopic Multiple Live Births   0 0 0 0 1       Home Medications    Prior to Admission medications   Medication Sig Start Date End Date Taking? Authorizing Provider  ibuprofen (ADVIL,MOTRIN) 200 MG tablet Take 200 mg by mouth every 6 (six) hours as needed.   Yes Historical Provider, MD  ferrous sulfate 325 (65 FE) MG tablet Take 1 tablet (325 mg total) by mouth 3 (three) times daily with meals. 11/02/10 11/02/11  Lutricia Feil, MD  nitrofurantoin (MACRODANTIN) 100 MG capsule Take 1 capsule (100 mg total) by mouth 4 (four) times daily. 06/15/16   Elvina Sidle, MD  prenatal vitamin w/FE, FA (PRENATAL 1 + 1) 27-1 MG TABS Take 1 tablet by mouth daily.      Historical Provider, MD    Family History No family history on file.  Social History Social History  Substance Use Topics  . Smoking status: Never Smoker  . Smokeless tobacco: Never Used  . Alcohol use No     Allergies   Patient has no known  allergies.   Review of Systems Review of Systems   Physical Exam Triage Vital Signs ED Triage Vitals [06/15/16 1153]  Enc Vitals Group     BP      Pulse      Resp      Temp      Temp src      SpO2      Weight      Height      Head Circumference      Peak Flow      Pain Score 9     Pain Loc      Pain Edu?      Excl. in GC?    No data found.   Updated Vital Signs BP 116/75 (BP Location: Left Arm)   Pulse (!) 105   Temp 100.1 F (37.8 C) (Oral)   Resp 20   SpO2 99%   Physical Exam  Constitutional: She is oriented to person, place, and time. She appears well-developed and well-nourished.  HENT:  Right Ear: External ear normal.  Left Ear: External ear normal.  Mouth/Throat: Oropharynx is clear and moist.  Eyes: Conjunctivae and EOM are normal. Pupils are equal, round, and reactive to light.  Neck: Normal  range of motion. Neck supple.  Cardiovascular: Normal rate, regular rhythm and normal heart sounds.   Pulmonary/Chest: Effort normal and breath sounds normal.  Abdominal: Soft. There is no tenderness.  Musculoskeletal: Normal range of motion.  Neurological: She is alert and oriented to person, place, and time.  Skin: Skin is warm and dry.  Nursing note and vitals reviewed.    UC Treatments / Results  Labs (all labs ordered are listed, but only abnormal results are displayed) Labs Reviewed  POCT URINALYSIS DIP (DEVICE) - Abnormal; Notable for the following:       Result Value   Hgb urine dipstick TRACE (*)    Leukocytes, UA TRACE (*)    All other components within normal limits  POCT I-STAT, CHEM 8 - Abnormal; Notable for the following:    Glucose, Bld 122 (*)    Hemoglobin 10.9 (*)    HCT 32.0 (*)    All other components within normal limits  URINE CULTURE  POCT PREGNANCY, URINE    EKG  EKG Interpretation None       Radiology No results found.  Procedures Procedures (including critical care time)  Medications Ordered in UC Medications  - No data to display   Initial Impression / Assessment and Plan / UC Course  I have reviewed the triage vital signs and the nursing notes.  Pertinent labs & imaging results that were available during my care of the patient were reviewed by me and considered in my medical decision making (see chart for details).   Final Clinical Impressions(s) / UC Diagnoses   Final diagnoses:  Lower urinary tract infectious disease  Other iron deficiency anemia    New Prescriptions New Prescriptions   NITROFURANTOIN (MACRODANTIN) 100 MG CAPSULE    Take 1 capsule (100 mg total) by mouth 4 (four) times daily.     Elvina Sidle, MD 06/15/16 1240

## 2016-06-15 NOTE — ED Triage Notes (Signed)
Onset Thursday of chills, weakness, right side pain, denies burning with urination

## 2016-06-15 NOTE — Discharge Instructions (Signed)
You appear to have a urinary tract infection. Antibiotics have been written to clear this up. If your symptoms persist by Friday, please return for recheck  We also see that you do have anemia. For this reason, you need to stay on your iron daily.

## 2016-06-17 LAB — URINE CULTURE: Culture: >=100000 — AB

## 2016-06-21 ENCOUNTER — Telehealth (HOSPITAL_COMMUNITY): Payer: Self-pay | Admitting: *Deleted

## 2016-06-21 MED ORDER — NITROFURANTOIN MACROCRYSTAL 100 MG PO CAPS: 100.0000 mg | ORAL_CAPSULE | Freq: Four times a day (QID) | ORAL | 0 refills | Status: DC

## 2017-08-16 ENCOUNTER — Ambulatory Visit (INDEPENDENT_AMBULATORY_CARE_PROVIDER_SITE_OTHER): Payer: 59 | Admitting: Family Medicine

## 2017-08-16 ENCOUNTER — Encounter: Payer: Self-pay | Admitting: Family Medicine

## 2017-08-16 VITALS — BP 98/70 | HR 80 | Temp 98.4°F | Ht 63.0 in | Wt 177.3 lb

## 2017-08-16 DIAGNOSIS — L309 Dermatitis, unspecified: Secondary | ICD-10-CM

## 2017-08-16 DIAGNOSIS — Z0001 Encounter for general adult medical examination with abnormal findings: Secondary | ICD-10-CM | POA: Diagnosis not present

## 2017-08-16 DIAGNOSIS — E049 Nontoxic goiter, unspecified: Secondary | ICD-10-CM

## 2017-08-16 DIAGNOSIS — Z13220 Encounter for screening for lipoid disorders: Secondary | ICD-10-CM

## 2017-08-16 DIAGNOSIS — Z131 Encounter for screening for diabetes mellitus: Secondary | ICD-10-CM | POA: Diagnosis not present

## 2017-08-16 DIAGNOSIS — Z0000 Encounter for general adult medical examination without abnormal findings: Secondary | ICD-10-CM

## 2017-08-16 DIAGNOSIS — L708 Other acne: Secondary | ICD-10-CM

## 2017-08-16 NOTE — Progress Notes (Signed)
Subjective:     Jasmine Silva is a 32 y.o. female and is here for a comprehensive physical exam. The patient reports problems - skin irrition and acne.  Pt notes irritation, drying and peeling of fingers after using hand sanitizer and soap at work.  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  Pt's fingers at times are sore and bleed from the irritation.  Pt also notes dark marks on face and legs.  Pt notes being sensitive to insect bites, they always leave a dark mark.  Pt also notes acne on her face that will be painful and swollen, then dry up and leave dark marks.  Pt has tried to increase po intake of water and has tried different OTC products for her face including coco butter.  Pt would like a referral to Derm.  Social Hx: Pt is originally from [place].  She has a now 44 yo child, delivered via c-section.  LMP 08/07/17.  Pt denies EtOH, tobacco, drug use.  Social History   Socioeconomic History  . Marital status: Single    Spouse name: Not on file  . Number of children: Not on file  . Years of education: Not on file  . Highest education level: Not on file  Occupational History  . Not on file  Social Needs  . Financial resource strain: Not on file  . Food insecurity:    Worry: Not on file    Inability: Not on file  . Transportation needs:    Medical: Not on file    Non-medical: Not on file  Tobacco Use  . Smoking status: Never Smoker  . Smokeless tobacco: Never Used  Substance and Sexual Activity  . Alcohol use: No  . Drug use: No  . Sexual activity: Not on file  Lifestyle  . Physical activity:    Days per week: Not on file    Minutes per session: Not on file  . Stress: Not on file  Relationships  . Social connections:    Talks on phone: Not on file    Gets together: Not on file    Attends religious service: Not on file    Active member of club or organization: Not on file    Attends meetings of clubs or organizations: Not on file    Relationship status: Not on file  .  Intimate partner violence:    Fear of current or ex partner: Not on file    Emotionally abused: Not on file    Physically abused: Not on file    Forced sexual activity: Not on file  Other Topics Concern  . Not on file  Social History Narrative  . Not on file   Health Maintenance  Topic Date Due  . PAP SMEAR  08/18/2005  . INFLUENZA VACCINE  09/21/2017  . TETANUS/TDAP  10/31/2020  . HIV Screening  Completed    The following portions of the patient's history were reviewed and updated as appropriate: allergies, current medications, past family history, past medical history, past social history, past surgical history and problem list.  Review of Systems A comprehensive review of systems was negative.   Objective:    BP 98/70 (BP Location: Left Arm, Patient Position: Sitting, Cuff Size: Large)   Pulse 80   Temp 98.4 F (36.9 C) (Oral)   Ht 5\' 3"  (1.6 m)   Wt 177 lb 4.8 oz (80.4 kg)   LMP 07/07/2017 (Exact Date)   SpO2 98%   BMI 31.41 kg/m  General appearance: alert, cooperative, appears stated age and no distress Head: Normocephalic, without obvious abnormality, atraumatic Eyes: conjunctivae/corneas clear. PERRL, EOM's intact. Fundi benign. Ears: normal TM's and external ear canals both ears Nose: Nares normal. Septum midline. Mucosa normal. No drainage or sinus tenderness. Throat: lips, mucosa, and tongue normal; teeth and gums normal Neck: no adenopathy, no JVD, supple, symmetrical, trachea midline and thyroid mildly enlarged, symmetric, no tenderness/mass/nodules Lungs: clear to auscultation bilaterally Heart: regular rate and rhythm, S1, S2 normal, no murmur, click, rub or gallop Abdomen: soft, non-tender; bowel sounds normal; no masses,  no organomegaly Extremities: extremities normal, atraumatic, no cyanosis or edema Skin: Skin color, texture, turgor normal. No rashes or lesions or face with several hyperpigmented plaques, dried pustules.  Several hyperpigemnted plaques  on UEs and LEs.  fingers extremly dry with peeling, macerated skin. no erythema or exudate. Neurologic: Alert and oriented X 3, normal strength and tone. Normal symmetric reflexes. Normal coordination and gait    Assessment:    Healthy female exam. With dermatitis and goiter.     Plan:     Anticipatory guidance given including wearing seatbelts, smoke detectors in the home, increasing physical activity, increasing p.o. intake of water and vegetables. -will obtain labs -pt to return for pap -next CPE in 1 yr. See After Visit Summary for Counseling Recommendations    Goiter -will obtain TSH, free T4 -discussed monitoring.  If increases in size or labs abnormal will proceed with thyroid u/s now.  Dermatitis -likely d/t chemical irritant (frequent hand sanitizer use) -discussed washing hands, using moisturizer frequently  -refer to Derm  Acne -discussed washing face BID, using a gentle cleanser -will refer to Derm   F/u prn  Abbe AmsterdamShannon Hali Balgobin, MD

## 2017-08-16 NOTE — Patient Instructions (Addendum)
Preventive Care 18-39 Years, Female Preventive care refers to lifestyle choices and visits with your health care provider that can promote health and wellness. What does preventive care include?  A yearly physical exam. This is also called an annual well check.  Dental exams once or twice a year.  Routine eye exams. Ask your health care provider how often you should have your eyes checked.  Personal lifestyle choices, including: ? Daily care of your teeth and gums. ? Regular physical activity. ? Eating a healthy diet. ? Avoiding tobacco and drug use. ? Limiting alcohol use. ? Practicing safe sex. ? Taking vitamin and mineral supplements as recommended by your health care provider. What happens during an annual well check? The services and screenings done by your health care provider during your annual well check will depend on your age, overall health, lifestyle risk factors, and family history of disease. Counseling Your health care provider may ask you questions about your:  Alcohol use.  Tobacco use.  Drug use.  Emotional well-being.  Home and relationship well-being.  Sexual activity.  Eating habits.  Work and work Statistician.  Method of birth control.  Menstrual cycle.  Pregnancy history.  Screening You may have the following tests or measurements:  Height, weight, and BMI.  Diabetes screening. This is done by checking your blood sugar (glucose) after you have not eaten for a while (fasting).  Blood pressure.  Lipid and cholesterol levels. These may be checked every 5 years starting at age 66.  Skin check.  Hepatitis C blood test.  Hepatitis B blood test.  Sexually transmitted disease (STD) testing.  BRCA-related cancer screening. This may be done if you have a family history of breast, ovarian, tubal, or peritoneal cancers.  Pelvic exam and Pap test. This may be done every 3 years starting at age 40. Starting at age 59, this may be done every 5  years if you have a Pap test in combination with an HPV test.  Discuss your test results, treatment options, and if necessary, the need for more tests with your health care provider. Vaccines Your health care provider may recommend certain vaccines, such as:  Influenza vaccine. This is recommended every year.  Tetanus, diphtheria, and acellular pertussis (Tdap, Td) vaccine. You may need a Td booster every 10 years.  Varicella vaccine. You may need this if you have not been vaccinated.  HPV vaccine. If you are 69 or younger, you may need three doses over 6 months.  Measles, mumps, and rubella (MMR) vaccine. You may need at least one dose of MMR. You may also need a second dose.  Pneumococcal 13-valent conjugate (PCV13) vaccine. You may need this if you have certain conditions and were not previously vaccinated.  Pneumococcal polysaccharide (PPSV23) vaccine. You may need one or two doses if you smoke cigarettes or if you have certain conditions.  Meningococcal vaccine. One dose is recommended if you are age 27-21 years and a first-year college student living in a residence hall, or if you have one of several medical conditions. You may also need additional booster doses.  Hepatitis A vaccine. You may need this if you have certain conditions or if you travel or work in places where you may be exposed to hepatitis A.  Hepatitis B vaccine. You may need this if you have certain conditions or if you travel or work in places where you may be exposed to hepatitis B.  Haemophilus influenzae type b (Hib) vaccine. You may need this if  you have certain risk factors.  Talk to your health care provider about which screenings and vaccines you need and how often you need them. This information is not intended to replace advice given to you by your health care provider. Make sure you discuss any questions you have with your health care provider. Document Released: 04/05/2001 Document Revised: 10/28/2015  Document Reviewed: 12/09/2014 Elsevier Interactive Patient Education  2018 [place] Dermatitis Dermatitis is redness, soreness, and swelling (inflammation) of the skin. Contact dermatitis is a reaction to certain substances that touch the skin. There are two types of contact dermatitis:  Irritant contact dermatitis. This type is caused by something that irritates your skin, such as dry hands from washing them too much. This type does not require previous exposure to the substance for a reaction to occur. This type is more common.  Allergic contact dermatitis. This type is caused by a substance that you are allergic to, such as a nickel allergy or poison ivy. This type only occurs if you have been exposed to the substance (allergen) before. Upon a repeat exposure, your body reacts to the substance. This type is less common.  What are the causes? Many different substances can cause contact dermatitis. Irritant contact dermatitis is most commonly caused by exposure to:  Makeup.  Soaps.  Detergents.  Bleaches.  Acids.  Metal salts, such as nickel.  Allergic contact dermatitis is most commonly caused by exposure to:  Poisonous plants.  Chemicals.  Jewelry.  Latex.  Medicines.  Preservatives in products, such as clothing.  What increases the risk? This condition is more likely to develop in:  People who have jobs that expose them to irritants or allergens.  People who have certain medical conditions, such as asthma or eczema.  What are the signs or symptoms? Symptoms of this condition may occur anywhere on your body where the irritant has touched you or is touched by you. Symptoms include:  Dryness or flaking.  Redness.  Cracks.  Itching.  Pain or a burning feeling.  Blisters.  Drainage of small amounts of blood or clear fluid from skin cracks.  With allergic contact dermatitis, there may also be swelling in areas such as the eyelids, mouth, or  genitals. How is this diagnosed? This condition is diagnosed with a medical history and physical exam. A patch skin test may be performed to help determine the cause. If the condition is related to your job, you may need to see an occupational medicine specialist. How is this treated? Treatment for this condition includes figuring out what caused the reaction and protecting your skin from further contact. Treatment may also include:  Steroid creams or ointments. Oral steroid medicines may be needed in more severe cases.  Antibiotics or antibacterial ointments, if a skin infection is present.  Antihistamine lotion or an antihistamine taken by mouth to ease itching.  A bandage (dressing).  Follow these instructions at home: Jansen your skin as needed.  Apply cool compresses to the affected areas.  Try taking a bath with: ? Epsom salts. Follow the instructions on the packaging. You can get these at your local pharmacy or grocery store. ? Baking soda. Pour a small amount into the bath as directed by your health care provider. ? Colloidal oatmeal. Follow the instructions on the packaging. You can get this at your local pharmacy or grocery store.  Try applying baking soda paste to your skin. Stir water into baking soda until it reaches a  paste-like consistency.  Do not scratch your skin.  Bathe less frequently, such as every other day.  Bathe in lukewarm water. Avoid using hot water. Medicines  Take or apply over-the-counter and prescription medicines only as told by your health care provider.  If you were prescribed an antibiotic medicine, take or apply your antibiotic as told by your health care provider. Do not stop using the antibiotic even if your condition starts to improve. General instructions  Keep all follow-up visits as told by your health care provider. This is important.  Avoid the substance that caused your reaction. If you do not know what caused it,  keep a journal to try to track what caused it. Write down: ? What you eat. ? What cosmetic products you use. ? What you drink. ? What you wear in the affected area. This includes jewelry.  If you were given a dressing, take care of it as told by your health care provider. This includes when to change and remove it. Contact a health care provider if:  Your condition does not improve with treatment.  Your condition gets worse.  You have signs of infection such as swelling, tenderness, redness, soreness, or warmth in the affected area.  You have a fever.  You have new symptoms. Get help right away if:  You have a severe headache, neck pain, or neck stiffness.  You vomit.  You feel very sleepy.  You notice red streaks coming from the affected area.  Your bone or joint underneath the affected area becomes painful after the skin has healed.  The affected area turns darker.  You have difficulty breathing. This information is not intended to replace advice given to you by your health care provider. Make sure you discuss any questions you have with your health care provider. Document Released: 02/05/2000 Document Revised: 07/16/2015 Document Reviewed: 06/25/2014 Elsevier Interactive Patient Education  2018 [place] A goiter is an enlarged thyroid gland. The thyroid gland is located in the lower front of the neck. The gland produces hormones that regulate mood, body temperature, pulse rate, and digestion. Most goiters are painless and are not a cause for serious concern. Goiters and conditions that cause goiters can be treated, if necessary. What are the causes? Causes of this condition include:  Diseases that attack healthy cells in your body (autoimmune diseases) and affect your thyroid function, such as: ? Graves disease. This causes too much thyroid hormone to be produced and it makes your thyroid overly active (hyperthyroidism). ? Hashimoto disease. This type of  inflammation of the thyroid (thyroiditis) causes too little thyroid hormone to be produced and it makes your thyroid not active enough (hypothyroidism).  Other conditions that cause thyroiditis.  Nodular goiter. This means that there are one or more small growths on your thyroid. These can create too much thyroid hormone.  Pregnancy.  Thyroid cancer. This is rare.  Certain medicines.  Radiation exposure.  Iodine deficiency.  In some cases, the cause may not be known (idiopathic). What increases the risk? This condition is more likely to develop in:  People who have a family history of goiter.  Women.  People who do not get enough iodine in their diet.  People who are older than 87.  People who smoke tobacco.  What are the signs or symptoms? Common symptoms of this condition include:  Swelling in the lower part of the neck. This swelling can range from a very small bump to a large lump.  A  tight feeling in the throat.  A hoarse voice.  Other symptoms include:  Coughing.  Wheezing.  Difficulty swallowing.  Difficulty breathing.  Bulging neck veins.  Dizziness.  In some cases, there are no symptoms and thyroid hormone levels may be normal. When a goiter is the result of hyperthyroidism, symptoms may also include:  Nervousness or restlessness.  Inability to tolerate heat.  Unexplained weight loss.  Diarrhea.  Change in the texture of hair or skin.  Changes in heart beat, such as skipped beats, extra beats, or a rapid heart rate.  Loss of menstruation.  Shaky hands.  Increased appetite.  Sleep problems.  When a goiter is the result of hypothyroidism, symptoms may also include:  Feeling like you have no energy (lethargy).  Inability to tolerate cold.  Weight gain that is not explained by a change in diet or exercise habits.  Dry skin.  Coarse hair.  Menstrual irregularity.  Constipation.  Sadness or depression.  How is this  diagnosed? This condition may be diagnosed with a medical history and physical exam. You may also have other tests, including:  Blood tests to check thyroid function.  Imaging tests, such as: ? Ultrasonography. ? CT scan. ? MRI. ? Thyroid scan. You will be given a safe radioactive injection, then images will be taken of your thyroid.  Tissue sample (biopsy) of the goiter or any nodules. This checks to see if the goiter or nodules are cancerous.  How is this treated? Treatment for this condition depends on the cause. Treatment may include:  Medicines to control your thyroid.  Anti-inflammatory or steroid medicines, if inflammation is the cause.  Iodine supplements or changes in diet, if the goiter is caused by iodine deficiency.  Radiation therapy.  Surgery to remove your thyroid.  In some cases, no treatment is necessary, and your health care provider will monitor your condition at regular checkups. Follow these instructions at home:  Follow recommendations from your health care provider for any changes to your diet.  Take over-the-counter and prescription medicines only as told by your health care provider.  Do not use any tobacco products, including cigarettes, chewing tobacco, or e-cigarettes. If you need help quitting, ask your health care provider.  Keep all follow-up appointments as told by your health care provider. This is important. Contact a health care provider if:  Your symptoms do not get better with treatment. Get help right away if:  You develop sudden, unexplained confusion or other mental changes.  You have nausea, vomiting, or diarrhea.  You develop a fever.  Your skin or the whites of your eyes appear yellow (jaundice).  You develop chest pain.  You have trouble breathing or swallowing.  You suddenly become very weak.  You experience extreme restlessness. This information is not intended to replace advice given to you by your health care  provider. Make sure you discuss any questions you have with your health care provider. Document Released: 07/28/2009 Document Revised: 08/28/2015 Document Reviewed: 02/03/2014 Elsevier Interactive Patient Education  [employer].

## 2017-08-17 LAB — T4, FREE: Free T4: 0.79 ng/dL (ref 0.60–1.60)

## 2017-08-17 LAB — TSH: TSH: 2.08 u[IU]/mL (ref 0.35–4.50)

## 2017-08-17 LAB — CBC WITH DIFFERENTIAL/PLATELET
BASOS PCT: 0.6 % (ref 0.0–3.0)
Basophils Absolute: 0 10*3/uL (ref 0.0–0.1)
EOS ABS: 0 10*3/uL (ref 0.0–0.7)
EOS PCT: 0.9 % (ref 0.0–5.0)
HEMATOCRIT: 35.8 % — AB (ref 36.0–46.0)
HEMOGLOBIN: 12 g/dL (ref 12.0–15.0)
LYMPHS PCT: 43.7 % (ref 12.0–46.0)
Lymphs Abs: 2.3 10*3/uL (ref 0.7–4.0)
MCHC: 33.6 g/dL (ref 30.0–36.0)
MCV: 77.1 fl — ABNORMAL LOW (ref 78.0–100.0)
Monocytes Absolute: 0.4 10*3/uL (ref 0.1–1.0)
Monocytes Relative: 8.3 % (ref 3.0–12.0)
Neutro Abs: 2.5 10*3/uL (ref 1.4–7.7)
Neutrophils Relative %: 46.5 % (ref 43.0–77.0)
PLATELETS: 259 10*3/uL (ref 150.0–400.0)
RBC: 4.64 Mil/uL (ref 3.87–5.11)
RDW: 14.4 % (ref 11.5–15.5)
WBC: 5.4 10*3/uL (ref 4.0–10.5)

## 2017-08-17 LAB — LIPID PANEL
CHOLESTEROL: 168 mg/dL (ref 0–200)
HDL: 51.3 mg/dL (ref >39.00)
LDL CALC: 100 mg/dL — AB (ref 0–99)
NonHDL: 117.01
TRIGLYCERIDES: 83 mg/dL (ref 0.0–149.0)
Total CHOL/HDL Ratio: 3
VLDL: 16.6 mg/dL (ref 0.0–40.0)

## 2017-08-17 LAB — BASIC METABOLIC PANEL
BUN: 9 mg/dL (ref 6–23)
CO2: 29 mEq/L (ref 19–32)
Calcium: 9.6 mg/dL (ref 8.4–10.5)
Chloride: 102 mEq/L (ref 96–112)
Creatinine, Ser: 0.68 mg/dL (ref 0.40–1.20)
GFR: 128.16 mL/min (ref >60.00)
GLUCOSE: 131 mg/dL — AB (ref 70–99)
POTASSIUM: 3.8 meq/L (ref 3.5–5.1)
Sodium: 136 mEq/L (ref 135–145)

## 2017-08-17 LAB — HEMOGLOBIN A1C: HEMOGLOBIN A1C: 5.9 % (ref 4.6–6.5)

## 2017-10-13 ENCOUNTER — Other Ambulatory Visit: Payer: Self-pay

## 2017-10-13 ENCOUNTER — Emergency Department (HOSPITAL_COMMUNITY): Payer: 59

## 2017-10-13 ENCOUNTER — Encounter (HOSPITAL_COMMUNITY): Payer: Self-pay | Admitting: Emergency Medicine

## 2017-10-13 ENCOUNTER — Observation Stay (HOSPITAL_COMMUNITY)
Admission: AD | Admit: 2017-10-13 | Discharge: 2017-10-15 | Disposition: A | Discharge status: Home / Self Care | Payer: 59 | Attending: Obstetrics and Gynecology | Admitting: Obstetrics and Gynecology

## 2017-10-13 DIAGNOSIS — N719 Inflammatory disease of uterus, unspecified: Secondary | ICD-10-CM | POA: Diagnosis present

## 2017-10-13 DIAGNOSIS — Z3A11 11 weeks gestation of pregnancy: Secondary | ICD-10-CM | POA: Diagnosis not present

## 2017-10-13 DIAGNOSIS — O039 Complete or unspecified spontaneous abortion without complication: Secondary | ICD-10-CM | POA: Diagnosis not present

## 2017-10-13 DIAGNOSIS — O035 Genital tract and pelvic infection following complete or unspecified spontaneous abortion: Secondary | ICD-10-CM | POA: Diagnosis not present

## 2017-10-13 DIAGNOSIS — Z3A00 Weeks of gestation of pregnancy not specified: Secondary | ICD-10-CM | POA: Diagnosis not present

## 2017-10-13 DIAGNOSIS — O209 Hemorrhage in early pregnancy, unspecified: Secondary | ICD-10-CM | POA: Diagnosis not present

## 2017-10-13 DIAGNOSIS — O99011 Anemia complicating pregnancy, first trimester: Secondary | ICD-10-CM | POA: Insufficient documentation

## 2017-10-13 DIAGNOSIS — Z79899 Other long term (current) drug therapy: Secondary | ICD-10-CM | POA: Insufficient documentation

## 2017-10-13 DIAGNOSIS — O9989 Other specified diseases and conditions complicating pregnancy, childbirth and the puerperium: Principal | ICD-10-CM | POA: Insufficient documentation

## 2017-10-13 DIAGNOSIS — R102 Pelvic and perineal pain: Secondary | ICD-10-CM | POA: Insufficient documentation

## 2017-10-13 DIAGNOSIS — R000 Tachycardia, unspecified: Secondary | ICD-10-CM | POA: Diagnosis not present

## 2017-10-13 DIAGNOSIS — R5084 Febrile nonhemolytic transfusion reaction: Secondary | ICD-10-CM | POA: Diagnosis not present

## 2017-10-13 LAB — CBC
HCT: 25.8 % — ABNORMAL LOW (ref 36.0–46.0)
Hemoglobin: 8.7 g/dL — ABNORMAL LOW (ref 12.0–15.0)
MCH: 25.8 pg — AB (ref 26.0–34.0)
MCHC: 33.7 g/dL (ref 30.0–36.0)
MCV: 76.6 fL — ABNORMAL LOW (ref 78.0–100.0)
Platelets: 144 10*3/uL — ABNORMAL LOW (ref 150–400)
RBC: 3.37 MIL/uL — ABNORMAL LOW (ref 3.87–5.11)
RDW: 13.8 % (ref 11.5–15.5)
WBC: 9.2 10*3/uL (ref 4.0–10.5)

## 2017-10-13 LAB — COMPREHENSIVE METABOLIC PANEL
ALBUMIN: 3.7 g/dL (ref 3.5–5.0)
ALK PHOS: 39 U/L (ref 38–126)
ALT: 18 U/L (ref 0–44)
AST: 18 U/L (ref 15–41)
Anion gap: 9 (ref 5–15)
BUN: 9 mg/dL (ref 6–20)
CHLORIDE: 104 mmol/L (ref 98–111)
CO2: 22 mmol/L (ref 22–32)
Calcium: 9.1 mg/dL (ref 8.9–10.3)
Creatinine, Ser: 0.86 mg/dL (ref 0.44–1.00)
GFR calc non Af Amer: >60 mL/min (ref >60)
GLUCOSE: 123 mg/dL — AB (ref 70–99)
Potassium: 3.6 mmol/L (ref 3.5–5.1)
SODIUM: 135 mmol/L (ref 135–145)
Total Bilirubin: 0.4 mg/dL (ref 0.3–1.2)
Total Protein: 6.8 g/dL (ref 6.5–8.1)

## 2017-10-13 LAB — CBC WITH DIFFERENTIAL/PLATELET
ABS IMMATURE GRANULOCYTES: 0 10*3/uL (ref 0.0–0.1)
BASOS PCT: 0 %
Basophils Absolute: 0 10*3/uL (ref 0.0–0.1)
EOS ABS: 0 10*3/uL (ref 0.0–0.7)
Eosinophils Relative: 0 %
HEMATOCRIT: 28 % — AB (ref 36.0–46.0)
Hemoglobin: 9.3 g/dL — ABNORMAL LOW (ref 12.0–15.0)
IMMATURE GRANULOCYTES: 0 %
LYMPHS ABS: 0.7 10*3/uL (ref 0.7–4.0)
Lymphocytes Relative: 13 %
MCH: 25.8 pg — AB (ref 26.0–34.0)
MCHC: 33.2 g/dL (ref 30.0–36.0)
MCV: 77.6 fL — AB (ref 78.0–100.0)
MONO ABS: 0.1 10*3/uL (ref 0.1–1.0)
MONOS PCT: 2 %
NEUTROS PCT: 85 %
Neutro Abs: 4.8 10*3/uL (ref 1.7–7.7)
Platelets: 220 10*3/uL (ref 150–400)
RBC: 3.61 MIL/uL — ABNORMAL LOW (ref 3.87–5.11)
RDW: 13.9 % (ref 11.5–15.5)
WBC: 5.6 10*3/uL (ref 4.0–10.5)

## 2017-10-13 LAB — URINALYSIS, ROUTINE W REFLEX MICROSCOPIC
Bilirubin Urine: NEGATIVE
Glucose, UA: NEGATIVE mg/dL
KETONES UR: NEGATIVE mg/dL
Leukocytes, UA: NEGATIVE
Nitrite: NEGATIVE
PROTEIN: NEGATIVE mg/dL
Specific Gravity, Urine: 1.015 (ref 1.005–1.030)
pH: 5 (ref 5.0–8.0)

## 2017-10-13 LAB — CBG MONITORING, ED
Glucose-Capillary: 68 mg/dL — ABNORMAL LOW (ref 70–99)
Glucose-Capillary: 107 mg/dL — ABNORMAL HIGH (ref 70–99)

## 2017-10-13 LAB — I-STAT BETA HCG BLOOD, ED (MC, WL, AP ONLY)

## 2017-10-13 LAB — I-STAT CG4 LACTIC ACID, ED
LACTIC ACID, VENOUS: 1.76 mmol/L (ref 0.5–1.9)
Lactic Acid, Venous: 1.26 mmol/L (ref 0.5–1.9)

## 2017-10-13 LAB — LIPASE, BLOOD: Lipase: 45 U/L (ref 11–51)

## 2017-10-13 LAB — TYPE AND SCREEN
ABO/RH(D): O POS
Antibody Screen: NEGATIVE

## 2017-10-13 LAB — HCG, QUANTITATIVE, PREGNANCY: HCG, BETA CHAIN, QUANT, S: 15077 m[IU]/mL — AB (ref <5)

## 2017-10-13 LAB — ABO/RH: ABO/RH(D): O POS

## 2017-10-13 MED ORDER — MISOPROSTOL 200 MCG PO TABS
800.0000 ug | ORAL_TABLET | Freq: Once | ORAL | Status: AC
  Administered 2017-10-13: 800 ug via VAGINAL
  Filled 2017-10-13: qty 4

## 2017-10-13 MED ORDER — ACETAMINOPHEN 500 MG PO TABS
1000.0000 mg | ORAL_TABLET | Freq: Once | ORAL | Status: AC
  Administered 2017-10-13: 1000 mg via ORAL
  Filled 2017-10-13: qty 2

## 2017-10-13 MED ORDER — SODIUM CHLORIDE 0.9 % IV BOLUS
1000.0000 mL | Freq: Once | INTRAVENOUS | Status: AC
  Administered 2017-10-13: 1000 mL via INTRAVENOUS

## 2017-10-13 MED ORDER — PIPERACILLIN-TAZOBACTAM 3.375 G IVPB
3.3750 g | Freq: Three times a day (TID) | INTRAVENOUS | Status: DC
  Administered 2017-10-14: 3.375 g via INTRAVENOUS
  Filled 2017-10-13 (×2): qty 50

## 2017-10-13 MED ORDER — SODIUM CHLORIDE 0.9 % IV SOLN
INTRAVENOUS | Status: DC | PRN
  Administered 2017-10-13: 500 mL via INTRAVENOUS
  Administered 2017-10-14: 1000 mL via INTRAVENOUS

## 2017-10-13 MED ORDER — PIPERACILLIN-TAZOBACTAM 3.375 G IVPB 30 MIN
3.3750 g | Freq: Once | INTRAVENOUS | Status: AC
  Administered 2017-10-13: 3.375 g via INTRAVENOUS
  Filled 2017-10-13: qty 50

## 2017-10-13 NOTE — ED Provider Notes (Signed)
[facility] EMERGENCY DEPARTMENT Provider Note   CSN: [id number] Arrival date & time: 10/13/17  1558     History   Chief Complaint Chief Complaint  Patient presents with  . Chills  . Weakness  . Headache    HPI Jasmine Silva is a 32 y.o. female.  The history is provided by the patient and medical records.  Abdominal Pain   This is a new problem. The current episode started more than 2 days ago. The problem occurs constantly. The problem has not changed since onset.The pain is associated with an unknown factor. The pain is located in the suprapubic region. The quality of the pain is aching, dull and cramping. The pain is at a severity of 8/10. The pain is severe. Associated symptoms include fever and headaches. Pertinent negatives include anorexia, diarrhea, melena, nausea, vomiting, constipation, dysuria and frequency. The symptoms are aggravated by palpation. Nothing relieves the symptoms.    Past Medical History:  Diagnosis Date  . Anemia   . No pertinent past medical history     Patient Active Problem List   Diagnosis Date Noted  . Goiter 08/16/2017    Past Surgical History:  Procedure Laterality Date  . CESAREAN SECTION  10/30/2010   Procedure: CESAREAN SECTION;  Surgeon: Scheryl Darter, MD;  Location: [facility];  Service: Gynecology;  Laterality: N/A;  Primary cesarean section with delivery of baby girl at 76. Apgars 8/9.  Marland Kitchen NO PAST SURGERIES       OB History    Gravida  2   Para  2   Term  2   Preterm  0   AB  0   Living  2     SAB  0   TAB  0   Ectopic  0   Multiple  0   Live Births  1            Home Medications    Prior to Admission medications   Medication Sig Start Date End Date Taking? Authorizing Provider  ferrous sulfate 325 (65 FE) MG tablet Take 1 tablet (325 mg total) by mouth 3 (three) times daily with meals. 11/02/10 11/02/11  Ward, Beverely Pace, MD  ibuprofen (ADVIL,MOTRIN) 200 MG tablet Take 200 mg by mouth every  6 (six) hours as needed.    [provider]    Family History No family history on file.  Social History Social History   Tobacco Use  . Smoking status: Never Smoker  . Smokeless tobacco: Never Used  Substance Use Topics  . Alcohol use: No  . Drug use: No     Allergies   Patient has no known allergies.   Review of Systems Review of Systems  Constitutional: Positive for chills, fatigue and fever.  HENT: Negative for rhinorrhea.   Eyes: Negative for visual disturbance.  Respiratory: Negative for cough, chest tightness and shortness of breath.   Cardiovascular: Negative for chest pain and palpitations.  Gastrointestinal: Positive for abdominal pain. Negative for anorexia, constipation, diarrhea, melena, nausea and vomiting.  Genitourinary: Positive for pelvic pain, vaginal bleeding and vaginal pain. Negative for dysuria and frequency.  Musculoskeletal: Negative for back pain, neck pain and neck stiffness.  Skin: Negative for rash and wound.  Neurological: Positive for light-headedness and headaches. Negative for dizziness, seizures, speech difficulty and weakness.  All other systems reviewed and are negative.    Physical Exam Updated Vital Signs BP 95/69   Pulse (!) 123   Temp (!) 100.7 F (38.2  C) (Oral)   Resp 17   Ht 5\' 3"  (1.6 m)   Wt 81.6 kg   LMP 10/06/2017   SpO2 100%   BMI 31.89 kg/m   Physical Exam  Constitutional: She is oriented to person, place, and time. She appears well-developed and well-nourished. No distress.  HENT:  Head: Normocephalic and atraumatic.  Right Ear: External ear normal.  Left Ear: External ear normal.  Nose: Nose normal.  Mouth/Throat: Oropharynx is clear and moist. No oropharyngeal exudate.  Eyes: Pupils are equal, round, and reactive to light. Conjunctivae and EOM are normal.  Neck: Normal range of motion. Neck supple.  Cardiovascular: Intact distal pulses. Tachycardia present.  No murmur heard. Pulmonary/Chest:  No stridor. No respiratory distress.  Abdominal: Soft. Normal appearance. She exhibits no distension. There is tenderness in the right lower quadrant, suprapubic area and left lower quadrant. There is no rigidity, no rebound, no guarding and no CVA tenderness.    Genitourinary: Pelvic exam was performed with patient supine. Cervix exhibits no motion tenderness, no discharge and no friability. Right adnexum displays no tenderness. Left adnexum displays no tenderness. There is bleeding in the vagina. No tenderness in the vagina. No vaginal discharge found.  Musculoskeletal: She exhibits no tenderness.  Neurological: She is alert and oriented to person, place, and time. She has normal reflexes. She displays normal reflexes. No cranial nerve deficit or sensory deficit. She exhibits normal muscle tone. Coordination normal.  Skin: Skin is warm. Capillary refill takes less than 2 seconds. No rash noted. She is not diaphoretic. No erythema.  Psychiatric: She has a normal mood and affect.  Nursing note and vitals reviewed.    ED Treatments / Results  Labs (all labs ordered are listed, but only abnormal results are displayed) Labs Reviewed  COMPREHENSIVE METABOLIC PANEL - Abnormal; Notable for the following components:      Result Value   Glucose, Bld 123 (*)    All other components within normal limits  CBC WITH DIFFERENTIAL/PLATELET - Abnormal; Notable for the following components:   RBC 3.61 (*)    Hemoglobin 9.3 (*)    HCT 28.0 (*)    MCV 77.6 (*)    MCH 25.8 (*)    All other components within normal limits  URINALYSIS, ROUTINE W REFLEX MICROSCOPIC - Abnormal; Notable for the following components:   Hgb urine dipstick MODERATE (*)    Bacteria, UA MANY (*)    All other components within normal limits  HCG, QUANTITATIVE, PREGNANCY - Abnormal; Notable for the following components:   hCG, Beta Chain, Quant, S 15,077 (*)    All other components within normal limits  CBC - Abnormal; Notable  for the following components:   RBC 3.37 (*)    Hemoglobin 8.7 (*)    HCT 25.8 (*)    MCV 76.6 (*)    MCH 25.8 (*)    Platelets 144 (*)    All other components within normal limits  CBG MONITORING, ED - Abnormal; Notable for the following components:   Glucose-Capillary 68 (*)    All other components within normal limits  I-STAT BETA HCG BLOOD, ED ([facility], [facility], [facility] ONLY) - Abnormal; Notable for the following components:   I-stat hCG, quantitative >2,000.0 (*)    All other components within normal limits  CBG MONITORING, ED - Abnormal; Notable for the following components:   Glucose-Capillary 107 (*)    All other components within normal limits  CULTURE, BLOOD (ROUTINE X 2)  CULTURE, BLOOD (ROUTINE X  2)  URINE CULTURE  LIPASE, BLOOD  I-STAT CG4 LACTIC ACID, ED  I-STAT CG4 LACTIC ACID, ED  TYPE AND SCREEN  ABO/RH  WET PREP  (BD AFFIRM) ([place])  GC/CHLAMYDIA PROBE AMP ([place]) NOT AT [facility]  WET PREP  (BD AFFIRM) ([place])    EKG EKG Interpretation  Date/Time:  Friday October 13 2017 16:40:46 EDT Ventricular Rate:  123 PR Interval:  142 QRS Duration: 66 QT Interval:  292 QTC Calculation: 418 R Axis:   76 Text Interpretation:  Sinus tachycardia Borderline T wave abnormalities Baseline wander in lead(s) V6 When cmpared to ECG earlier, similar tachyradia.  No STEMI Confirmed by Theda Belfast ([id number]) on 10/13/2017 5:00:31 PM   Radiology Dg Chest 2 View  Result Date: 10/13/2017 CLINICAL DATA:  Febrile, weakness with headache EXAM: CHEST - 2 VIEW COMPARISON:  07/08/2011 FINDINGS: The heart size and mediastinal contours are within normal limits. Both lungs are clear. The visualized skeletal structures are unremarkable. IMPRESSION: No active cardiopulmonary disease. Electronically Signed   By: Jasmine Pang M.D.   On: 10/13/2017 19:09   US Ob Comp < 14 Wks  Result Date: 10/13/2017 CLINICAL DATA:  Abdominal pain and vaginal bleeding today EXAM: LIMITED OBSTETRIC ULTRASOUND  AND TRANSVAGINAL OBSTETRIC ULTRASOUND FINDINGS: No intrauterine gestational sac identified. Heterogeneous and irregular endometrium, conceivably representing blood products. The uterus measures 10.4 by 6.3 by 6.5 cm. Probable small nabothian cyst along the cervix. The right ovary measures 3.2 by 1.9 by 2.6 cm and the left ovary measures 3.0 by 1.3 by 1.8 cm. There is a complex lesion of the right ovary measuring 1.6 by 1.0 by 1.9 cm, without visualized gestational sac or fetal pole associated with this ovarian lesion. There is no free pelvic fluid. IMPRESSION: 1. No visible intrauterine pregnancy; no free pelvic fluid. Complex heterogeneous thickened endometrium favoring blood products. There is a complex 1.9 by 1.6 by 1.0 cm lesion of the right ovary which is probably a corpus luteum, and less likely in the context of today's findings and the overall clinical scenario to be an ectopic pregnancy. I recommend correlation with the quantitative beta HCG trend and a low threshold for reimaging if the beta HCG does not drop off in the expected fashion. Electronically Signed   By: Gaylyn Rong M.D.   On: 10/13/2017 19:43   US Ob Transvaginal  Result Date: 10/13/2017 CLINICAL DATA:  Abdominal pain and vaginal bleeding today EXAM: LIMITED OBSTETRIC ULTRASOUND AND TRANSVAGINAL OBSTETRIC ULTRASOUND FINDINGS: No intrauterine gestational sac identified. Heterogeneous and irregular endometrium, conceivably representing blood products. The uterus measures 10.4 by 6.3 by 6.5 cm. Probable small nabothian cyst along the cervix. The right ovary measures 3.2 by 1.9 by 2.6 cm and the left ovary measures 3.0 by 1.3 by 1.8 cm. There is a complex lesion of the right ovary measuring 1.6 by 1.0 by 1.9 cm, without visualized gestational sac or fetal pole associated with this ovarian lesion. There is no free pelvic fluid. IMPRESSION: 1. No visible intrauterine pregnancy; no free pelvic fluid. Complex heterogeneous thickened  endometrium favoring blood products. There is a complex 1.9 by 1.6 by 1.0 cm lesion of the right ovary which is probably a corpus luteum, and less likely in the context of today's findings and the overall clinical scenario to be an ectopic pregnancy. I recommend correlation with the quantitative beta HCG trend and a low threshold for reimaging if the beta HCG does not drop off in the expected fashion. Electronically Signed  By: Gaylyn Rong M.D.   On: 10/13/2017 19:43    Procedures Procedures (including critical care time)  CRITICAL CARE Performed by: Canary Brim Brittni Hult Total critical care time: 45 minutes Critical care time was exclusive of separately billable procedures and treating other patients. Critical care was necessary to treat or prevent imminent or life-threatening deterioration. Critical care was time spent personally by me on the following activities: development of treatment plan with patient and/or surrogate as well as nursing, discussions with consultants, evaluation of patient's response to treatment, examination of patient, obtaining history from patient or surrogate, ordering and performing treatments and interventions, ordering and review of laboratory studies, ordering and review of radiographic studies, pulse oximetry and re-evaluation of patient's condition.   Medications Ordered in ED Medications  misoprostol (CYTOTEC) tablet 800 mcg (has no administration in time range)  piperacillin-tazobactam (ZOSYN) IVPB 3.375 g (3.375 g Intravenous New Bag/Given 10/13/17 2046)  piperacillin-tazobactam (ZOSYN) IVPB 3.375 g (has no administration in time range)  0.9 %  sodium chloride infusion (500 mLs Intravenous New Bag/Given 10/13/17 2045)  acetaminophen (TYLENOL) tablet 1,000 mg (1,000 mg Oral Given 10/13/17 1617)  sodium chloride 0.9 % bolus 1,000 mL (0 mLs Intravenous Stopped 10/13/17 1833)  sodium chloride 0.9 % bolus 1,000 mL (0 mLs Intravenous Stopped 10/13/17 1944)    sodium chloride 0.9 % bolus 1,000 mL (1,000 mLs Intravenous New Bag/Given 10/13/17 1955)     Initial Impression / Assessment and Plan / ED Course  I have reviewed the triage vital signs and the nursing notes.  Pertinent labs & imaging results that were available during my care of the patient were reviewed by me and considered in my medical decision making (see chart for details).     Jasmine Silva is a 32 y.o. female with a past medical history significant for anemia, prior C-section and who is reportedly 2-1/2 months pregnant who presents with fevers, chills, lower abdominal pain, vaginal bleeding, fatigue, and some headache.  Patient reports that for the last 6 days she has been having abdominal pain in her lower abdomen.  She reports she also started having vaginal bleeding.  She reports that she is doing half months pregnant and her last menstrual.  Per the chart was on 08/07/2017.  This puts her approximately 9 weeks and 5 days pregnant.  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  She reports that she continued to feel worse including developing a headache.  She says she has not had a headache all week with the abdominal pain and vaginal bleeding.  She is unsure if she is having a miscarriage or not.  She reports no nausea or vomiting.  She denies any vision changes.  She denies any neck pain or neck stiffness.  She does report a mild headache with photophobia and feels dehydrated.  She reports her abdominal pain is moderate to severe in her lower abdomen.  She reported as 8 out of 10 in severity at times.  She reports no significant cough or congestion.  She denies chest pain or shortness of breath.  She denies any history of STI and denies any vaginal discharge.  She denies recent trauma.  On exam, patient had lower abdominal tenderness.  Lungs were clear and chest was nontender.  Patient was  tachycardic and had blood pressures around 100 systolic range.  Patient was febrile on exam.   A pelvic  exam was performed with vaginal bleeding coming from the cervical loss.  No significant cervical motion tenderness or adnexal tenderness is present.  Initial concern for pregnancy, lower abdominal pain, and vaginal bleeding was ruling out an ectopic pregnancy.  I have lower suspicion for this given her lack of adnexal tenderness on exam however with her fevers, chills, fast heart rate and soft blood pressures, endometritis is also considered.  Patient denied dysuria however will obtain urinalysis.  Chest x-ray will be obtained to look for pneumonia.  Given her lack of any nuchal rigidity or neck stiffness or neck pain, I have very low suspicion for meningitis.   Patient will have pelvic ultrasound performed to look for abnormalities.  She will be given fluids and have screening laboratory testing.  Patient reported feeling better after fluids were started.  Blood pressure improved.  Hemoglobin came back at 9.3.  No leukocytosis present.  Lactic acid was not elevated which goes against sepsis at this time.  CMP showed no AKI and no liver abnormality.  Electro lites reassuring.  Patient's glucose was in the 60s initially before coming downstairs to the ED and it improved with a snack.  Anticipate following up on lab results and ultrasound results after work-up.  At minimum I suspect patient has a threatened miscarriage and may have other infection as well.  Patient's laboratory testing showed downtrending hemoglobin of 8.7.  I suspect this is due partially to dilution with the fluid she is been getting.  Blood pressure remains in the 90s.  Patient's beta hCG is only 15,000, I suspect she is in the midst of a miscarriage.  Ultrasound showed no intrauterine pregnancy likely confirming miscarriage.  She has heterogeneous endometrial tissue, the setting of her infection I suspect she has endometritis.  The  ultrasound did not confirm ectopic pregnancy however there was a possible right sided cyst.  Next  OB/GYN was called who recommended Zosyn for antibiotics and vaginal Cytotec.  They recommended she be admitted to [facility] hospital under their service for further management of the endometritis.  Patient will be admitted for further management.   Final Clinical Impressions(s) / ED Diagnoses   Final diagnoses:  Endometritis  Miscarriage    ED Discharge Orders    None      Clinical Impression: 1. Endometritis   2. Miscarriage     Disposition: Admit  This note was prepared with assistance of Dragon voice recognition software. Occasional wrong-word or sound-a-like substitutions may have occurred due to the inherent limitations of voice recognition software.     Mayling Aber, Canary Brim, MD 10/14/17 [id number]

## 2017-10-13 NOTE — ED Provider Notes (Signed)
Patient placed in Quick Look pathway, seen and evaluated   Chief Complaint: Headache, feeling cold.   HPI:   Patient developed sudden onset headache and chills.  She woks in the cafeteria and was noticed to be standing next to the heat lamp.  She is pre diabetic. Patient is not volunteering much information.  She reports that about 2 hours ago she started feeling poorly with headache and cold.    ROS: Fever.  No visual changes  Physical Exam:   Gen: No distress  Neuro: Awake and Alert  Skin: Warm    Focused Exam: Patient is shivering, delayed responding.  A and Ox4 but not interacting appropriately.  She turns her head around with no evidence of neck being rigid.     Initiation of care has begun. The patient has been counseled on the process, plan, and necessity for staying for the completion/evaluation, and the remainder of the medical screening examination  Patient sugar is slightly low.  Given apple juice PO.    1618: Spoke with charge RN that patient needs to go to a room, not waiting room.    Jasmine Silva, Jasmine Boulanger W, PA-C 10/13/17 1621    Jasmine Silva, Stephen, MD 10/19/17 1431

## 2017-10-13 NOTE — Progress Notes (Signed)
Pharmacy Antibiotic Note  Jasmine Silva is a 32 y.o. female admitted on 10/13/2017 with intraabdominal infection.  Pharmacy has been consulted for zosyn dosing.  Plan: Zosyn 3.375g IV q8h (4 hour infusion) F/u BCx results Monitor patients Scr and CBC  Height: 5\' 3"  (160 cm) Weight: 180 lb (81.6 kg) IBW/kg (Calculated) : 52.4  Temp (24hrs), Avg:100.7 F (38.2 C), Min:100.7 F (38.2 C), Max:100.7 F (38.2 C)  Recent Labs  Lab 10/13/17 1627 10/13/17 1649 10/13/17 1823 10/13/17 2002  WBC 5.6  --   --  9.2  CREATININE 0.86  --   --   --   LATICACIDVEN  --  1.76 1.26  --     Estimated Creatinine Clearance: 94.2 mL/min (by C-G formula based on SCr of 0.86 mg/dL).    No Known Allergies  Antimicrobials this admission: Zosyn 3.375g IV q8h 8/23 >>  Microbiology results: BCx 8/23 >> sent UCx 8/23 >> sent  Thank you for allowing pharmacy to be a part of this patient's care.  Otis Peakebecca M Byanka Landrus, PharmD PGY1 Pharmacy Resident 10/13/2017 8:30 PM

## 2017-10-13 NOTE — ED Notes (Signed)
Called US for status, pt is next (about 30 minutes)

## 2017-10-13 NOTE — ED Notes (Signed)
Patient transported to X-ray 

## 2017-10-13 NOTE — ED Notes (Signed)
Pt given apple juice in triage for CBG of 68.

## 2017-10-13 NOTE — ED Triage Notes (Signed)
Pt was upstairs working when she had a sudden onset of weakness and headache with chills at 1400 today. Fever in triage. Pt is A&Ox4.

## 2017-10-14 ENCOUNTER — Other Ambulatory Visit: Payer: Self-pay

## 2017-10-14 ENCOUNTER — Encounter (HOSPITAL_COMMUNITY): Payer: Self-pay

## 2017-10-14 DIAGNOSIS — O9989 Other specified diseases and conditions complicating pregnancy, childbirth and the puerperium: Secondary | ICD-10-CM | POA: Diagnosis not present

## 2017-10-14 DIAGNOSIS — O99011 Anemia complicating pregnancy, first trimester: Secondary | ICD-10-CM | POA: Diagnosis not present

## 2017-10-14 DIAGNOSIS — N719 Inflammatory disease of uterus, unspecified: Secondary | ICD-10-CM | POA: Diagnosis not present

## 2017-10-14 DIAGNOSIS — Z3A11 11 weeks gestation of pregnancy: Secondary | ICD-10-CM | POA: Diagnosis not present

## 2017-10-14 DIAGNOSIS — Z79899 Other long term (current) drug therapy: Secondary | ICD-10-CM | POA: Diagnosis not present

## 2017-10-14 DIAGNOSIS — O035 Genital tract and pelvic infection following complete or unspecified spontaneous abortion: Secondary | ICD-10-CM | POA: Diagnosis not present

## 2017-10-14 DIAGNOSIS — R102 Pelvic and perineal pain: Secondary | ICD-10-CM | POA: Diagnosis not present

## 2017-10-14 LAB — CBC
HCT: 26.7 % — ABNORMAL LOW (ref 36.0–46.0)
HEMOGLOBIN: 9.6 g/dL — AB (ref 12.0–15.0)
MCH: 26.8 pg (ref 26.0–34.0)
MCHC: 36 g/dL (ref 30.0–36.0)
MCV: 74.6 fL — AB (ref 78.0–100.0)
PLATELETS: 196 10*3/uL (ref 150–400)
RBC: 3.58 MIL/uL — AB (ref 3.87–5.11)
RDW: 14.2 % (ref 11.5–15.5)
WBC: 9.9 10*3/uL (ref 4.0–10.5)

## 2017-10-14 LAB — TYPE AND SCREEN
ABO/RH(D): O POS
ANTIBODY SCREEN: NEGATIVE

## 2017-10-14 LAB — HCG, QUANTITATIVE, PREGNANCY: hCG, Beta Chain, Quant, S: 10312 m[IU]/mL — ABNORMAL HIGH (ref <5)

## 2017-10-14 MED ORDER — PIPERACILLIN-TAZOBACTAM 3.375 G IVPB
3.3750 g | Freq: Three times a day (TID) | INTRAVENOUS | Status: DC
  Administered 2017-10-14 – 2017-10-15 (×4): 3.375 g via INTRAVENOUS
  Filled 2017-10-14 (×5): qty 50

## 2017-10-14 MED ORDER — ACETAMINOPHEN 325 MG PO TABS
650.0000 mg | ORAL_TABLET | ORAL | Status: DC | PRN
  Administered 2017-10-14: 650 mg via ORAL
  Filled 2017-10-14: qty 2

## 2017-10-14 MED ORDER — SODIUM CHLORIDE 0.9 % IV SOLN
INTRAVENOUS | Status: DC
  Administered 2017-10-14 – 2017-10-15 (×2): via INTRAVENOUS

## 2017-10-14 MED ORDER — ZOLPIDEM TARTRATE 5 MG PO TABS: 5.0000 mg | ORAL_TABLET | Freq: Every evening | ORAL | Status: DC | PRN

## 2017-10-14 MED ORDER — ACETAMINOPHEN 500 MG PO TABS
1000.0000 mg | ORAL_TABLET | Freq: Once | ORAL | Status: AC
  Administered 2017-10-14: 1000 mg via ORAL
  Filled 2017-10-14: qty 2

## 2017-10-14 MED ORDER — DOCUSATE SODIUM 100 MG PO CAPS
100.0000 mg | ORAL_CAPSULE | Freq: Every day | ORAL | Status: DC
  Administered 2017-10-14 – 2017-10-15 (×2): 100 mg via ORAL
  Filled 2017-10-14 (×2): qty 1

## 2017-10-14 MED ORDER — CALCIUM CARBONATE ANTACID 500 MG PO CHEW: 2.0000 | CHEWABLE_TABLET | ORAL | Status: DC | PRN

## 2017-10-14 MED ORDER — SODIUM CHLORIDE 0.9 % IV SOLN
100.0000 mg | Freq: Two times a day (BID) | INTRAVENOUS | Status: DC
  Administered 2017-10-14 (×2): 100 mg via INTRAVENOUS
  Filled 2017-10-14 (×3): qty 100

## 2017-10-14 MED ORDER — PRENATAL MULTIVITAMIN CH
1.0000 | ORAL_TABLET | Freq: Every day | ORAL | Status: DC
  Administered 2017-10-14 – 2017-10-15 (×2): 1 via ORAL
  Filled 2017-10-14 (×2): qty 1

## 2017-10-14 MED ORDER — MISOPROSTOL 200 MCG PO TABS
800.0000 ug | ORAL_TABLET | Freq: Two times a day (BID) | ORAL | Status: DC
  Administered 2017-10-14 (×2): 800 ug via ORAL
  Filled 2017-10-14 (×3): qty 4

## 2017-10-14 NOTE — MAU Note (Signed)
[facility]. Works at [employer]. Around 3 pm on Friday patient began to feel "hot" and not well. Was sent to ED. Initially worked up for Sepsis but later was evaluated for miscarriage as she told them she is pregnant . Patient took HPT a few weeks ago.  Thinks she's about [redacted] weeks pregnant.  Bleeding started a  week ago. Bled a lot the first day but did not measure as she wasn't wearing a pad. She has kept bleeding since then. Today "not bleeding very much."  She is feeling cold, chills and headache. Has had nausea but no emesis.

## 2017-10-14 NOTE — Progress Notes (Signed)
Early pregnancy loss with endometritis    Subjective:   [id number] Patient reports feels fine no complaints, bleeding has essentially stopped and her lower abdominal pain is better.    Objective: I have reviewed patient's vital signs, intake and output, medications, labs and radiology results.  General: alert, cooperative and no distress GI: soft, non-tender; bowel sounds normal; no masses,  no organomegaly   Assessment/Plan: Endometrtitis s/p completed SAB: zosyn and doxycycline  Pt is both objectively and subjectively better Temp to 102 during the night Will need 24-48 hours more of IV antibiotics, pt aware understands but wants to go home   LOS: 0 days    Lazaro ArmsLuther H Sheyanne Munley 10/14/2017, 7:27 AM

## 2017-10-14 NOTE — H&P (Signed)
Jasmine Silva is an 32 y.o. female G3P2002 admitted for retained POCs and endometritis following miscarriage in early pregnancy.   She presented to [facility] @ 11 weeks by unsure LMP with fever, chills, dizziness, abdominal pain and vaginal bleeding. She reports LMP sometime in July, then onset of heavy dark red bleeding with clots 1 week ago. Bleeding slowed and is only scant light red bleeding today.  While at work, she became dizzy and felt chills.  She had recent positive HPT so quant hcg was done and was 15,077.  US showed thickened endometrium c/w blood products, with complex mass c/w corpus luteum cyst on right ovary.  No evidence of IUP or ectopic pregnancy.  She was found to be febrile at 100.7 without elevated WBCs.     Pertinent Gynecological History: Menses: flow is moderate Bleeding: usually regular Contraception: none DES exposure: denies Blood transfusions: none Sexually transmitted diseases: no past history Previous GYN Procedures: none  Last mammogram: n/a  Last pap: unknown  OB History: G3, P2   Menstrual History:  Patient's last menstrual period was 09/05/2017 (approximate).    Past Medical History:  Diagnosis Date  . Anemia   . No pertinent past medical history     Past Surgical History:  Procedure Laterality Date  . CESAREAN SECTION  10/30/2010   Procedure: CESAREAN SECTION;  Surgeon: Scheryl Darter, MD;  Location: [facility];  Service: Gynecology;  Laterality: N/A;  Primary cesarean section with delivery of baby girl at 93. Apgars 8/9.  Marland Kitchen NO PAST SURGERIES      No family history on file.  Social History:  reports that she has never smoked. She has never used smokeless tobacco. She reports that she does not drink alcohol or use drugs.  Allergies: No Known Allergies  Medications Prior to Admission  Medication Sig Dispense Refill Last Dose  . ibuprofen (ADVIL,MOTRIN) 200 MG tablet Take 200-400 mg by mouth every 6 (six) hours as needed.    unk  . ferrous sulfate 325  (65 FE) MG tablet Take 1 tablet (325 mg total) by mouth 3 (three) times daily with meals. (Patient not taking: Reported on 10/13/2017) 30 tablet 2 Not Taking at Unknown time    Review of Systems  Constitutional: Positive for chills, fever and malaise/fatigue.  Respiratory: Negative for shortness of breath.   Cardiovascular: Negative for chest pain.  Gastrointestinal: Positive for abdominal pain. Negative for constipation, diarrhea, nausea and vomiting.  Neurological: Positive for dizziness. Negative for headaches.  All other systems reviewed and are negative.   Blood pressure 120/64, pulse (!) 103, temperature (!) 100.4 F (38 C), temperature source Oral, resp. rate 20, height 5\' 3"  (1.6 m), weight 81.6 kg, last menstrual period 09/05/2017, SpO2 98 %. Physical Exam  Results for orders placed or performed during the hospital encounter of 10/13/17 (from the past 24 hour(s))  CBG monitoring, ED     Status: Abnormal   Collection Time: 10/13/17  4:07 PM  Result Value Ref Range   Glucose-Capillary 68 (L) 70 - 99 mg/dL   Comment 1 Notify RN    Comment 2 Document in Chart   Urinalysis, Routine w reflex microscopic     Status: Abnormal   Collection Time: 10/13/17  4:12 PM  Result Value Ref Range   Color, Urine YELLOW YELLOW   APPearance CLEAR CLEAR   Specific Gravity, Urine 1.015 1.005 - 1.030   pH 5.0 5.0 - 8.0   Glucose, UA NEGATIVE NEGATIVE mg/dL   Hgb urine dipstick MODERATE (  A) NEGATIVE   Bilirubin Urine NEGATIVE NEGATIVE   Ketones, ur NEGATIVE NEGATIVE mg/dL   Protein, ur NEGATIVE NEGATIVE mg/dL   Nitrite NEGATIVE NEGATIVE   Leukocytes, UA NEGATIVE NEGATIVE   WBC, UA 0-5 0 - 5 WBC/hpf   Bacteria, UA MANY (A) NONE SEEN   Squamous Epithelial / LPF 0-5 0 - 5   Mucus PRESENT   Comprehensive metabolic panel     Status: Abnormal   Collection Time: 10/13/17  4:27 PM  Result Value Ref Range   Sodium 135 135 - 145 mmol/L   Potassium 3.6 3.5 - 5.1 mmol/L   Chloride 104 98 - 111 mmol/L    CO2 22 22 - 32 mmol/L   Glucose, Bld 123 (H) 70 - 99 mg/dL   BUN 9 6 - 20 mg/dL   Creatinine, Ser 1.610.86 0.44 - 1.00 mg/dL   Calcium 9.1 8.9 - 09.610.3 mg/dL   Total Protein 6.8 6.5 - 8.1 g/dL   Albumin 3.7 3.5 - 5.0 g/dL   AST 18 15 - 41 U/L   ALT 18 0 - 44 U/L   Alkaline Phosphatase 39 38 - 126 U/L   Total Bilirubin 0.4 0.3 - 1.2 mg/dL   GFR calc non Af Amer >60 >60 mL/min   GFR calc Af Amer >60 >60 mL/min   Anion gap 9 5 - 15  CBC WITH DIFFERENTIAL     Status: Abnormal   Collection Time: 10/13/17  4:27 PM  Result Value Ref Range   WBC 5.6 4.0 - 10.5 K/uL   RBC 3.61 (L) 3.87 - 5.11 MIL/uL   Hemoglobin 9.3 (L) 12.0 - 15.0 g/dL   HCT 04.528.0 (L) 40.936.0 - 81.146.0 %   MCV 77.6 (L) 78.0 - 100.0 fL   MCH 25.8 (L) 26.0 - 34.0 pg   MCHC 33.2 30.0 - 36.0 g/dL   RDW 91.413.9 78.211.5 - 95.615.5 %   Platelets 220 150 - 400 K/uL   Neutrophils Relative % 85 %   Neutro Abs 4.8 1.7 - 7.7 K/uL   Lymphocytes Relative 13 %   Lymphs Abs 0.7 0.7 - 4.0 K/uL   Monocytes Relative 2 %   Monocytes Absolute 0.1 0.1 - 1.0 K/uL   Eosinophils Relative 0 %   Eosinophils Absolute 0.0 0.0 - 0.7 K/uL   Basophils Relative 0 %   Basophils Absolute 0.0 0.0 - 0.1 K/uL   Immature Granulocytes 0 %   Abs Immature Granulocytes 0.0 0.0 - 0.1 K/uL  I-Stat beta hCG blood, ED     Status: Abnormal   Collection Time: 10/13/17  4:47 PM  Result Value Ref Range   I-stat hCG, quantitative >2,000.0 (H) <5 mIU/mL   Comment 3          I-Stat CG4 Lactic Acid, ED  (not at  [facility])     Status: None   Collection Time: 10/13/17  4:49 PM  Result Value Ref Range   Lactic Acid, Venous 1.76 0.5 - 1.9 mmol/L  hCG, quantitative, pregnancy     Status: Abnormal   Collection Time: 10/13/17  5:20 PM  Result Value Ref Range   hCG, Beta Chain, Quant, S 15,077 (H) <5 mIU/mL  Type and screen [facility]     Status: None   Collection Time: 10/13/17  5:20 PM  Result Value Ref Range   ABO/RH(D) O POS    Antibody Screen NEG    Sample  Expiration      10/16/2017 Performed at [facility], [address] [place], [place] [address]   Lipase, blood     Status: None   Collection Time: 10/13/17  5:20 PM  Result Value Ref Range   Lipase 45 11 - 51 U/L  ABO/Rh     Status: None   Collection Time: 10/13/17  5:20 PM  Result Value Ref Range   ABO/RH(D)      O POS Performed at [facility], [address] [place], [place] [address]   I-Stat CG4 Lactic Acid, ED  (not at  [facility])     Status: None   Collection Time: 10/13/17  6:23 PM  Result Value Ref Range   Lactic Acid, Venous 1.26 0.5 - 1.9 mmol/L  CBG monitoring, ED     Status: Abnormal   Collection Time: 10/13/17  7:41 PM  Result Value Ref Range   Glucose-Capillary 107 (H) 70 - 99 mg/dL  CBC     Status: Abnormal   Collection Time: 10/13/17  8:02 PM  Result Value Ref Range   WBC 9.2 4.0 - 10.5 K/uL   RBC 3.37 (L) 3.87 - 5.11 MIL/uL   Hemoglobin 8.7 (L) 12.0 - 15.0 g/dL   HCT 82.9 (L) 56.2 - 13.0 %   MCV 76.6 (L) 78.0 - 100.0 fL   MCH 25.8 (L) 26.0 - 34.0 pg   MCHC 33.7 30.0 - 36.0 g/dL   RDW 86.5 78.4 - 69.6 %   Platelets 144 (L) 150 - 400 K/uL    Dg Chest 2 View  Result Date: 10/13/2017 CLINICAL DATA:  Febrile, weakness with headache EXAM: CHEST - 2 VIEW COMPARISON:  07/08/2011 FINDINGS: The heart size and mediastinal contours are within normal limits. Both lungs are clear. The visualized skeletal structures are unremarkable. IMPRESSION: No active cardiopulmonary disease. Electronically Signed   By: Jasmine Pang M.D.   On: 10/13/2017 19:09   US Ob Comp < 14 Wks  Result Date: 10/13/2017 CLINICAL DATA:  Abdominal pain and vaginal bleeding today EXAM: LIMITED OBSTETRIC ULTRASOUND AND TRANSVAGINAL OBSTETRIC ULTRASOUND FINDINGS: No intrauterine gestational sac identified. Heterogeneous and irregular endometrium, conceivably representing blood products. The uterus measures 10.4 by 6.3 by 6.5 cm. Probable small nabothian cyst along the cervix. The right ovary  measures 3.2 by 1.9 by 2.6 cm and the left ovary measures 3.0 by 1.3 by 1.8 cm. There is a complex lesion of the right ovary measuring 1.6 by 1.0 by 1.9 cm, without visualized gestational sac or fetal pole associated with this ovarian lesion. There is no free pelvic fluid. IMPRESSION: 1. No visible intrauterine pregnancy; no free pelvic fluid. Complex heterogeneous thickened endometrium favoring blood products. There is a complex 1.9 by 1.6 by 1.0 cm lesion of the right ovary which is probably a corpus luteum, and less likely in the context of today's findings and the overall clinical scenario to be an ectopic pregnancy. I recommend correlation with the quantitative beta HCG trend and a low threshold for reimaging if the beta HCG does not drop off in the expected fashion. Electronically Signed   By: Gaylyn Rong M.D.   On: 10/13/2017 19:43   US Ob Transvaginal  Result Date: 10/13/2017 CLINICAL DATA:  Abdominal pain and vaginal bleeding today EXAM: LIMITED OBSTETRIC ULTRASOUND AND TRANSVAGINAL OBSTETRIC ULTRASOUND FINDINGS: No intrauterine gestational sac identified. Heterogeneous and irregular endometrium, conceivably representing blood products. The uterus measures 10.4 by 6.3 by 6.5 cm. Probable small nabothian cyst along the cervix. The right ovary measures 3.2 by 1.9 by 2.6  cm and the left ovary measures 3.0 by 1.3 by 1.8 cm. There is a complex lesion of the right ovary measuring 1.6 by 1.0 by 1.9 cm, without visualized gestational sac or fetal pole associated with this ovarian lesion. There is no free pelvic fluid. IMPRESSION: 1. No visible intrauterine pregnancy; no free pelvic fluid. Complex heterogeneous thickened endometrium favoring blood products. There is a complex 1.9 by 1.6 by 1.0 cm lesion of the right ovary which is probably a corpus luteum, and less likely in the context of today's findings and the overall clinical scenario to be an ectopic pregnancy. I recommend correlation with the  quantitative beta HCG trend and a low threshold for reimaging if the beta HCG does not drop off in the expected fashion. Electronically Signed   By: Gaylyn Rong M.D.   On: 10/13/2017 19:43    Assessment/Plan: 1. Endometritis   2. Miscarriage     Cytotec 800 mcg PO dose given in ED prior to admission.  Admit to [facility] Unit for Zosyn Q 6 hours and IV fluids for suspected endometritis Tylenol 1000 mg x 1 dose in MAU, then start 650 mg Q 4 in 6 hours from MAU dose Regular diet Continuous pulse ox Urine culture pending  Sharen Counter 10/14/2017, 2:04 AM

## 2017-10-14 NOTE — MAU Note (Signed)
Report given to Nadine RN

## 2017-10-15 ENCOUNTER — Other Ambulatory Visit: Payer: Self-pay | Admitting: Obstetrics & Gynecology

## 2017-10-15 ENCOUNTER — Encounter (HOSPITAL_COMMUNITY): Payer: Self-pay

## 2017-10-15 DIAGNOSIS — O99011 Anemia complicating pregnancy, first trimester: Secondary | ICD-10-CM | POA: Diagnosis not present

## 2017-10-15 DIAGNOSIS — Z3A11 11 weeks gestation of pregnancy: Secondary | ICD-10-CM | POA: Diagnosis not present

## 2017-10-15 DIAGNOSIS — O035 Genital tract and pelvic infection following complete or unspecified spontaneous abortion: Secondary | ICD-10-CM | POA: Diagnosis not present

## 2017-10-15 DIAGNOSIS — O9989 Other specified diseases and conditions complicating pregnancy, childbirth and the puerperium: Secondary | ICD-10-CM | POA: Diagnosis not present

## 2017-10-15 DIAGNOSIS — R102 Pelvic and perineal pain: Secondary | ICD-10-CM | POA: Diagnosis not present

## 2017-10-15 DIAGNOSIS — N719 Inflammatory disease of uterus, unspecified: Secondary | ICD-10-CM | POA: Diagnosis not present

## 2017-10-15 DIAGNOSIS — Z79899 Other long term (current) drug therapy: Secondary | ICD-10-CM | POA: Diagnosis not present

## 2017-10-15 LAB — CBC WITH DIFFERENTIAL/PLATELET
Basophils Absolute: 0 10*3/uL (ref 0.0–0.1)
Basophils Relative: 0 %
EOS ABS: 0 10*3/uL (ref 0.0–0.7)
EOS PCT: 1 %
HCT: 25.3 % — ABNORMAL LOW (ref 36.0–46.0)
Hemoglobin: 8.9 g/dL — ABNORMAL LOW (ref 12.0–15.0)
LYMPHS ABS: 1.9 10*3/uL (ref 0.7–4.0)
LYMPHS PCT: 41 %
MCH: 26.4 pg (ref 26.0–34.0)
MCHC: 35.2 g/dL (ref 30.0–36.0)
MCV: 75.1 fL — ABNORMAL LOW (ref 78.0–100.0)
MONOS PCT: 6 %
Monocytes Absolute: 0.3 10*3/uL (ref 0.1–1.0)
Neutro Abs: 2.5 10*3/uL (ref 1.7–7.7)
Neutrophils Relative %: 52 %
PLATELETS: 189 10*3/uL (ref 150–400)
RBC: 3.37 MIL/uL — AB (ref 3.87–5.11)
RDW: 14.4 % (ref 11.5–15.5)
WBC: 4.8 10*3/uL (ref 4.0–10.5)

## 2017-10-15 LAB — URINE CULTURE: Culture: >=100000 — AB

## 2017-10-15 MED ORDER — MAGIC MOUTHWASH: 5.0000 mL | Freq: Four times a day (QID) | ORAL | 1 refills | Status: DC | PRN

## 2017-10-15 MED ORDER — DOXYCYCLINE HYCLATE 100 MG PO TABS
100.0000 mg | ORAL_TABLET | Freq: Two times a day (BID) | ORAL | Status: DC
  Administered 2017-10-15: 100 mg via ORAL
  Filled 2017-10-15 (×3): qty 1

## 2017-10-15 MED ORDER — CIPROFLOXACIN HCL 500 MG PO TABS: 500.0000 mg | ORAL_TABLET | Freq: Two times a day (BID) | ORAL | 0 refills | Status: DC

## 2017-10-15 MED ORDER — KETOROLAC TROMETHAMINE 10 MG PO TABS: 10.0000 mg | ORAL_TABLET | Freq: Three times a day (TID) | ORAL | 0 refills | Status: DC | PRN

## 2017-10-15 MED ORDER — MAGIC MOUTHWASH
5.0000 mL | Freq: Four times a day (QID) | ORAL | Status: DC | PRN
  Administered 2017-10-15: 5 mL via ORAL
  Filled 2017-10-15 (×2): qty 5

## 2017-10-15 MED ORDER — DOXYCYCLINE HYCLATE 100 MG PO TABS: 100.0000 mg | ORAL_TABLET | Freq: Two times a day (BID) | ORAL | 0 refills | Status: DC

## 2017-10-15 MED ORDER — LEVOFLOXACIN IN D5W 750 MG/150ML IV SOLN
750.0000 mg | Freq: Once | INTRAVENOUS | Status: AC
  Administered 2017-10-15: 750 mg via INTRAVENOUS
  Filled 2017-10-15: qty 150

## 2017-10-15 NOTE — Progress Notes (Signed)
  Endometritis s/p SAB   Subjective: Patient reports feels much better minimal bleeding Tolerating diet, wants to go home.    Objective: I have reviewed patient's vital signs, intake and output, medications, labs and radiology results.  General: alert, cooperative and no distress GI: soft, non-tender; bowel sounds normal; no masses,  no organomegaly   Assessment/Plan: Endometritis post SAB, pt really wants to go home tonight, daughters first day of school tomorrow, have not told her but if she is afebrile today I will give her a levaquin dose and then let her go home tonight   Discharge on cipro and doxycycline  LOS: 0 days    Lazaro ArmsLuther H Eure 10/15/2017, 10:06 AM

## 2017-10-15 NOTE — Discharge Instructions (Signed)
Endometritis °Endometritis is irritation, soreness, or inflammation that affects the lining of the uterus (endometrium). °Infection is usually the cause of endometritis. It is important to get treatment to prevent complications. Common complications may include more severe infections and not being able to have children(infertility). °What are the causes? °This condition may be caused by: °· Bacterial infections. °· STIs (sexually transmitted infections). °· A miscarriage or childbirth, especially after a long labor or cesarean delivery. °· Certain gynecological procedures. These may include dilation and curettage (D&C), hysteroscopy, or birth control (contraceptive) insertion. °· Tuberculosis (TB). ° °What are the signs or symptoms? °Symptoms of this condition include: °· Fever. °· Lower abdomen (abdominal) pain. °· Pelvis (pelvic) pain. °· Abnormal vaginal discharge or bleeding. °· Abdominal bloating (distention) or swelling. °· General discomfort or generally feeling ill. °· Discomfort with bowel movements. °· Constipation. ° °How is this diagnosed? °This condition may be diagnosed based on: °· A physical exam, including a pelvic exam. °· Tests, such as: °? Blood tests. °? Removal of a sample of endometrial tissue for testing (endometrial biopsy). °? Examining a sample of vaginal discharge under a microscope (wet prep). °? Removal of a sample of fluid from the cervix for testing (cervical culture). °? Surgical examination of the pelvis and abdomen. ° °How is this treated? °This condition is treated with: °· Antibiotic medicines. °· For more severe cases, hospitalization may be needed to give fluids and antibiotics directly into a vein through an IV tube. ° °Follow these instructions at home: °· Take over-the-counter and prescription medicines only as told by your health care provider. °· Drink enough fluid to keep your urine clear or pale yellow. °· Take your antibiotic medicine as told by your health care  provider. Do not stop taking the antibiotic even if you start to feel better. °· Do not douche or have sex (including vaginal, oral, and anal sex) until your health care provider approves. °· If your endometritis was caused by an STI, do not have sex (including vaginal, oral, and anal sex) until your partner has also been treated for the STI. °· Return to your normal activities as told by your health care provider. Ask your health care provider what activities are safe for you. °· Keep all follow-up visits as told by your health care provider. This is important. °Contact a health care provider if: °· You have pain that does not get better with medicine. °· You have a fever. °· You have pain with bowel movements. °Get help right away if: °· You have abdominal swelling. °· You have abdominal pain that gets worse. °· You have bad-smelling vaginal discharge, or an increased amount of vaginal discharge. °· You have abnormal vaginal bleeding. °· You have nausea and vomiting. °Summary °· Endometritis affects the lining of the uterus (endometrium) and is usually caused by an infection. °· It is important to get treatment to prevent complications. °· You have several treatment options for endometritis. Treatment may include antibiotics and IV fluids. °· Take your antibiotic medicine as told by your health care provider. Do not stop taking the antibiotic even if you start to feel better. °· Do not douche or have sex (including vaginal, oral, and anal sex) until your health care provider approves. °This information is not intended to replace advice given to you by your health care provider. Make sure you discuss any questions you have with your health care provider. °Document Released: 02/01/2001 Document Revised: 02/23/2016 Document Reviewed: 02/23/2016 °Elsevier Interactive Patient Education © 2017   [employer]vier Inc. ° °

## 2017-10-15 NOTE — Discharge Summary (Signed)
Physician Discharge Summary  Patient ID: Jasmine Silva MRN: [id number] DOB/AGE: Nov 21, 1985 32 y.o.  Admit date: 10/13/2017 Discharge date: 10/15/2017  Admission Diagnoses: Endometritis post SAB Discharge Diagnoses:  Active Problems:   Endometritis   Discharged Condition: stable  Hospital Course: endometritis diagnosed and placed on zosyn doxycycline repsonded well became afebrile and WBC dropped, exam normalized  Consults: None  Significant Diagnostic Studies: labs:  and scan  Treatments: antibiotics: Zosyn and doxycycline  Discharge Exam: Blood pressure 107/64, pulse 78, temperature 99.3 F (37.4 C), temperature source Oral, resp. rate 18, height 5\' 3"  (1.6 m), weight 81.6 kg, last menstrual period 09/05/2017, SpO2 100 %. General appearance: alert, cooperative and no distress Abdomen is benign Disposition: Discharge disposition: 01-Home or Self Care       Discharge Instructions    Call MD for:  persistant nausea and vomiting   Complete by:  As directed    Call MD for:  severe uncontrolled pain   Complete by:  As directed    Call MD for:  temperature >100.4   Complete by:  As directed    Diet - low sodium heart healthy   Complete by:  As directed    Increase activity slowly   Complete by:  As directed    No wound care   Complete by:  As directed    Sexual Activity Restrictions   Complete by:  As directed    No sex for 4 weeks     Allergies as of 10/15/2017   No Known Allergies     Medication List    TAKE these medications   ciprofloxacin 500 MG tablet Commonly known as:  CIPRO Take 1 tablet (500 mg total) by mouth 2 (two) times daily.   doxycycline 100 MG tablet Commonly known as:  VIBRA-TABS Take 1 tablet (100 mg total) by mouth every 12 (twelve) hours.   ferrous sulfate 325 (65 FE) MG tablet Take 1 tablet (325 mg total) by mouth 3 (three) times daily with meals.   ibuprofen 200 MG tablet Commonly known as:  ADVIL,MOTRIN Take 200-400 mg by  mouth every 6 (six) hours as needed.   ketorolac 10 MG tablet Commonly known as:  TORADOL Take 1 tablet (10 mg total) by mouth every 8 (eight) hours as needed.   magic mouthwash Soln Take 5 mLs by mouth 4 (four) times daily as needed for mouth pain.      Follow-up Information    [facility]   Contact information: [address] [place] [phone]          Signed: Lazaro ArmsLuther H [name] 10/15/2017, 6:29 PM

## 2017-10-15 NOTE — Progress Notes (Signed)
Discharge instructions given to patient and she verbalized understanding of all iinstructions given.

## 2017-10-16 LAB — GC/CHLAMYDIA PROBE AMP (~~LOC~~) NOT AT ARMC
Chlamydia: NEGATIVE
Neisseria Gonorrhea: NEGATIVE

## 2017-10-18 LAB — CULTURE, BLOOD (ROUTINE X 2)
CULTURE: NO GROWTH
CULTURE: NO GROWTH
SPECIAL REQUESTS: ADEQUATE
Special Requests: ADEQUATE

## 2019-04-26 DIAGNOSIS — N946 Dysmenorrhea, unspecified: Secondary | ICD-10-CM | POA: Diagnosis not present

## 2019-04-26 DIAGNOSIS — Z3A32 32 weeks gestation of pregnancy: Secondary | ICD-10-CM | POA: Diagnosis not present

## 2019-04-26 DIAGNOSIS — N926 Irregular menstruation, unspecified: Secondary | ICD-10-CM | POA: Diagnosis not present

## 2019-07-05 DIAGNOSIS — Z13220 Encounter for screening for lipoid disorders: Secondary | ICD-10-CM | POA: Diagnosis not present

## 2019-07-05 DIAGNOSIS — Z01419 Encounter for gynecological examination (general) (routine) without abnormal findings: Secondary | ICD-10-CM | POA: Diagnosis not present

## 2019-07-05 DIAGNOSIS — N946 Dysmenorrhea, unspecified: Secondary | ICD-10-CM | POA: Diagnosis not present

## 2019-07-05 DIAGNOSIS — Z6830 Body mass index (BMI) 30.0-30.9, adult: Secondary | ICD-10-CM | POA: Diagnosis not present

## 2019-07-05 DIAGNOSIS — Z0000 Encounter for general adult medical examination without abnormal findings: Secondary | ICD-10-CM | POA: Diagnosis not present

## 2019-07-05 DIAGNOSIS — Z3009 Encounter for other general counseling and advice on contraception: Secondary | ICD-10-CM | POA: Diagnosis not present

## 2019-07-05 DIAGNOSIS — Z130 Encounter for screening for diseases of the blood and blood-forming organs and certain disorders involving the immune mechanism: Secondary | ICD-10-CM | POA: Diagnosis not present

## 2019-07-05 DIAGNOSIS — Z1151 Encounter for screening for human papillomavirus (HPV): Secondary | ICD-10-CM | POA: Diagnosis not present

## 2019-10-01 DIAGNOSIS — Z3149 Encounter for other procreative investigation and testing: Secondary | ICD-10-CM | POA: Diagnosis not present

## 2019-10-08 DIAGNOSIS — Z3169 Encounter for other general counseling and advice on procreation: Secondary | ICD-10-CM | POA: Diagnosis not present

## 2019-10-08 DIAGNOSIS — N979 Female infertility, unspecified: Secondary | ICD-10-CM | POA: Diagnosis not present

## 2019-10-30 DIAGNOSIS — N970 Female infertility associated with anovulation: Secondary | ICD-10-CM | POA: Diagnosis not present

## 2020-06-08 DIAGNOSIS — H5213 Myopia, bilateral: Secondary | ICD-10-CM | POA: Diagnosis not present

## 2020-08-05 ENCOUNTER — Other Ambulatory Visit: Payer: Self-pay

## 2020-08-05 ENCOUNTER — Other Ambulatory Visit (HOSPITAL_COMMUNITY): Payer: Self-pay

## 2020-08-05 ENCOUNTER — Encounter (HOSPITAL_COMMUNITY): Payer: Self-pay | Admitting: Internal Medicine

## 2020-08-05 ENCOUNTER — Other Ambulatory Visit (HOSPITAL_COMMUNITY): Payer: Self-pay | Admitting: Internal Medicine

## 2020-08-05 ENCOUNTER — Ambulatory Visit (HOSPITAL_COMMUNITY)
Admission: RE | Admit: 2020-08-05 | Discharge: 2020-08-05 | Disposition: A | Discharge status: Home / Self Care | Payer: 59 | Source: Ambulatory Visit | Attending: Internal Medicine | Admitting: Internal Medicine

## 2020-08-05 VITALS — BP 129/78 | HR 85 | Wt 170.0 lb

## 2020-08-05 DIAGNOSIS — R002 Palpitations: Secondary | ICD-10-CM | POA: Insufficient documentation

## 2020-08-05 DIAGNOSIS — Z79899 Other long term (current) drug therapy: Secondary | ICD-10-CM | POA: Insufficient documentation

## 2020-08-05 DIAGNOSIS — R7303 Prediabetes: Secondary | ICD-10-CM | POA: Insufficient documentation

## 2020-08-05 DIAGNOSIS — F419 Anxiety disorder, unspecified: Secondary | ICD-10-CM | POA: Insufficient documentation

## 2020-08-05 DIAGNOSIS — R079 Chest pain, unspecified: Secondary | ICD-10-CM | POA: Insufficient documentation

## 2020-08-05 DIAGNOSIS — F32A Depression, unspecified: Secondary | ICD-10-CM | POA: Diagnosis not present

## 2020-08-05 DIAGNOSIS — R0789 Other chest pain: Secondary | ICD-10-CM | POA: Diagnosis not present

## 2020-08-05 MED ORDER — CITALOPRAM HYDROBROMIDE 10 MG PO TABS
10.0000 mg | ORAL_TABLET | Freq: Every day | ORAL | 6 refills | Status: AC
  Filled 2020-08-05: qty 30, 30d supply, fill #0

## 2020-08-05 NOTE — Progress Notes (Signed)
CARDIOLOGY CLINIC NEW PATIENT NOTE   Primary Care: None  Primary Cardiologist: New   HPI:  Jasmine Silva is a 35 y/o woman with borderline DM2 but no other medical problems who works here in [employer]. Presents for further evaluation of CP and palpitations.   Has been under a lot of family stress lately now getting frequent chest pains and severe palpitations. Notes symptoms come on mostly with emotional stress but sometimes with exertion. Very tearful and anxious. No syncope or presyncope. + SOB   Review of Systems: [y] = yes, [ ]  = no   General: Weight gain [ ] ; Weight loss [ ] ; Anorexia [ ] ; Fatigue [ ] ; Fever [ ] ; Chills [ ] ; Weakness [ ]   Cardiac: Chest pain/pressure [ y]; Resting SOB [ y]; Exertional SOB [ ] ; Orthopnea [ ] ; Pedal Edema [ ] ; Palpitations [ ] ; Syncope [ ] ; Presyncope [ ] ; Paroxysmal nocturnal dyspnea[ ]   Pulmonary: Cough [ ] ; Wheezing[ ] ; Hemoptysis[ ] ; Sputum [ ] ; Snoring [ ]   GI: Vomiting[ ] ; Dysphagia[ ] ; Melena[ ] ; Hematochezia [ ] ; Heartburn[ ] ; Abdominal pain [ ] ; Constipation [ ] ; Diarrhea [ ] ; BRBPR [ ]   GU: Hematuria[ ] ; Dysuria [ ] ; Nocturia[ ]   Vascular: Pain in legs with walking [ ] ; Pain in feet with lying flat [ ] ; Non-healing sores [ ] ; Stroke [ ] ; TIA [ ] ; Slurred speech [ ] ;  Neuro: Headaches[ ] ; Vertigo[ ] ; Seizures[ ] ; Paresthesias[ ] ;Blurred vision [ ] ; Diplopia [ ] ; Vision changes [ ]   Ortho/Skin: Arthritis [ ] ; Joint pain [ ] ; Muscle pain [ ] ; Joint swelling [ ] ; Back Pain [ ] ; Rash [ ]   Psych: Depression[ y]; Anxiety[ y]  Heme: Bleeding problems [ ] ; Clotting disorders [ ] ; Anemia [ ]   Endocrine: Diabetes [ y]; Thyroid dysfunction[ ]    Past Medical History:  Diagnosis Date   Anemia    No pertinent past medical history     Current Outpatient Medications  Medication Sig Dispense Refill   citalopram (CELEXA) 10 MG tablet Take 1 tablet (10 mg total) by mouth daily. 30 tablet 6   No current facility-administered medications for this  encounter.    No Known Allergies    Social History   Socioeconomic History   Marital status: Single    Spouse name: Not on file   Number of children: Not on file   Years of education: Not on file   Highest education level: Not on file  Occupational History   Not on file  Tobacco Use   Smoking status: Never   Smokeless tobacco: Never  Vaping Use   Vaping Use: Never used  Substance and Sexual Activity   Alcohol use: No   Drug use: No   Sexual activity: Not on file  Other Topics Concern   Not on file  Social History Narrative   Not on file   Social Determinants of Health   Financial Resource Strain: Not on file  Food Insecurity: Not on file  Transportation Needs: Not on file  Physical Activity: Not on file  Stress: Not on file  Social Connections: Not on file  Intimate Partner Violence: Not on file    No family HX of premature CAD or SCD.   Vitals:   08/05/20 1608  BP: 129/78  Pulse: 85  SpO2: 98%  Weight: 77.1 kg (170 lb)    PHYSICAL EXAM: General:  Well appearing. No respiratory difficulty HEENT: normal Neck: supple. no JVD. Carotids 2+ bilat; no bruits.  No lymphadenopathy or thryomegaly appreciated. Cor: PMI nondisplaced. Regular rate & rhythm. No rubs, gallops or murmurs. Lungs: clear Abdomen: soft, nontender, nondistended. No hepatosplenomegaly. No bruits or masses. Good bowel sounds. Extremities: no cyanosis, clubbing, rash, edema Neuro: alert & oriented x 3, cranial nerves grossly intact. moves all 4 extremities w/o difficulty. Affect tearful/anxious  ECG: NSR 81 No ST-T wave abnormalities. Personally reviewed   ASSESSMENT & PLAN:  Chest pain and palpitations - suspect due to anxiety but with severity of symptoms and h/o DM2 will get ZIO patch and echo 2.   Depression/anxiety - start Celexa - SW has seen in Clinic  Arvilla Meres, MD  10:00 PM

## 2020-08-05 NOTE — Patient Instructions (Signed)
Start Celexa 10 mg Daily  Your physician has requested that you have an echocardiogram. Echocardiography is a painless test that uses sound waves to create images of your heart. It provides your doctor with information about the size and shape of your heart and how well your heart's chambers and valves are working. This procedure takes approximately one hour. There are no restrictions for this procedure.  Your provider has recommended that  you wear a Zio Patch for 7 days.  This monitor will record your heart rhythm for our review.  IF you have any symptoms while wearing the monitor please press the button.  If you have any issues with the patch or you notice a red or orange light on it please call the company at [phone].  Once you remove the patch please mail it back to the company as soon as possible so we can get the results.  Please call our office in November to schedule your follow up appointment  If you have any questions or concerns before your next appointment please send [place] a message through [place] or call our office at [phone].    TO LEAVE A MESSAGE FOR THE NURSE SELECT OPTION 2, PLEASE LEAVE A MESSAGE INCLUDING: YOUR NAME DATE OF BIRTH CALL BACK NUMBER REASON FOR CALL**this is important as we prioritize the call backs  YOU WILL RECEIVE A CALL BACK THE SAME DAY AS LONG AS YOU CALL BEFORE 4:00 PM  At the [facility], you and your health needs are our priority. As part of our continuing mission to provide you with exceptional heart care, we have created designated Provider Care Teams. These Care Teams include your primary Cardiologist (physician) and Advanced Practice Providers (APPs- Physician Assistants and Nurse Practitioners) who all work together to provide you with the care you need, when you need it.   You may see any of the following providers on your designated Care Team at your next follow up: Dr Arvilla Meres Dr Marca Ancona Dr Brandon Melnick, NP Robbie Lis, [place] Mikki Santee Karle Plumber, PharmD   Please be sure to bring in all your medications bottles to every appointment.

## 2020-08-18 DIAGNOSIS — R002 Palpitations: Secondary | ICD-10-CM | POA: Diagnosis not present

## 2020-11-06 DIAGNOSIS — Z7689 Persons encountering health services in other specified circumstances: Secondary | ICD-10-CM | POA: Diagnosis not present

## 2021-07-30 ENCOUNTER — Other Ambulatory Visit (HOSPITAL_COMMUNITY): Payer: Self-pay

## 2022-07-26 ENCOUNTER — Other Ambulatory Visit: Payer: Self-pay

## 2022-07-26 ENCOUNTER — Encounter (HOSPITAL_COMMUNITY): Payer: Self-pay | Admitting: Emergency Medicine

## 2022-07-26 ENCOUNTER — Emergency Department (HOSPITAL_COMMUNITY)
Admission: EM | Admit: 2022-07-26 | Discharge: 2022-07-27 | Disposition: A | Discharge status: Home / Self Care | Payer: Commercial Managed Care - PPO | Attending: Emergency Medicine | Admitting: Emergency Medicine

## 2022-07-26 DIAGNOSIS — R29818 Other symptoms and signs involving the nervous system: Secondary | ICD-10-CM | POA: Diagnosis not present

## 2022-07-26 DIAGNOSIS — R42 Dizziness and giddiness: Secondary | ICD-10-CM | POA: Diagnosis not present

## 2022-07-26 DIAGNOSIS — R55 Syncope and collapse: Secondary | ICD-10-CM | POA: Insufficient documentation

## 2022-07-26 LAB — CBC WITH DIFFERENTIAL/PLATELET
Abs Immature Granulocytes: 0.01 10*3/uL (ref 0.00–0.07)
Basophils Absolute: 0 10*3/uL (ref 0.0–0.1)
Basophils Relative: 0 %
Eosinophils Absolute: 0.1 10*3/uL (ref 0.0–0.5)
Eosinophils Relative: 2 %
HCT: 36.1 % (ref 36.0–46.0)
Hemoglobin: 12.2 g/dL (ref 12.0–15.0)
Immature Granulocytes: 0 %
Lymphocytes Relative: 34 %
Lymphs Abs: 1.9 10*3/uL (ref 0.7–4.0)
MCH: 24.7 pg — ABNORMAL LOW (ref 26.0–34.0)
MCHC: 33.8 g/dL (ref 30.0–36.0)
MCV: 73.1 fL — ABNORMAL LOW (ref 80.0–100.0)
Monocytes Absolute: 0.5 10*3/uL (ref 0.1–1.0)
Monocytes Relative: 8 %
Neutro Abs: 3.1 10*3/uL (ref 1.7–7.7)
Neutrophils Relative %: 56 %
Platelets: 278 10*3/uL (ref 150–400)
RBC: 4.94 MIL/uL (ref 3.87–5.11)
RDW: 13.8 % (ref 11.5–15.5)
WBC: 5.5 10*3/uL (ref 4.0–10.5)
nRBC: 0 % (ref 0.0–0.2)

## 2022-07-26 LAB — MAGNESIUM: Magnesium: 1.7 mg/dL (ref 1.7–2.4)

## 2022-07-26 LAB — BASIC METABOLIC PANEL
Anion gap: 8 (ref 5–15)
BUN: 7 mg/dL (ref 6–20)
CO2: 25 mmol/L (ref 22–32)
Calcium: 9.5 mg/dL (ref 8.9–10.3)
Chloride: 103 mmol/L (ref 98–111)
Creatinine, Ser: 0.55 mg/dL (ref 0.44–1.00)
GFR, Estimated: >60 mL/min (ref >60)
Glucose, Bld: 137 mg/dL — ABNORMAL HIGH (ref 70–99)
Potassium: 3.7 mmol/L (ref 3.5–5.1)
Sodium: 136 mmol/L (ref 135–145)

## 2022-07-26 NOTE — ED Provider Triage Note (Signed)
Emergency Medicine Provider Triage Evaluation Note  Jasmine Silva , a 37 y.o. female  was evaluated in triage.  Pt complains of dizziness/lightheadedness associated with nausea since 2 PM.  Denies chest pain, shortness of breath, abdominal pain or other complaints.  Review of Systems  Positive: As above Negative: As above  Physical Exam  BP 126/82 (BP Location: Right Arm)   Pulse 80   Temp 97.9 F (36.6 C) (Oral)   Resp 19   Ht 5\' 3"  (1.6 m)   Wt 77.1 kg   LMP 07/19/2022   SpO2 100%   BMI 30.11 kg/m  Gen:   Awake, no distress   Resp:  Normal effort  MSK:   Moves extremities without difficulty  Other:    Medical Decision Making  Medically screening exam initiated at 7:51 PM.  Appropriate orders placed.  [name] was informed that the remainder of the evaluation will be completed by another provider, this initial triage assessment does not replace that evaluation, and the importance of remaining in the ED until their evaluation is complete.     Marita Kansas, PA-C 07/26/22 1952

## 2022-07-26 NOTE — ED Triage Notes (Signed)
Pt arrives via wheelchair accompanied by daughter Jasmine Silva. Pt was at home walking out of the house when she felt sudden dizziness and feeling like she was going to pass out around 2:00 pm. S/s have not resolved since. Pt's daughter at bedside sts she wasn't responding for approx 3 min. In triage pt sts she feels like her body is heavy. No LOC.

## 2022-07-27 ENCOUNTER — Emergency Department (HOSPITAL_COMMUNITY): Payer: Commercial Managed Care - PPO

## 2022-07-27 DIAGNOSIS — R29818 Other symptoms and signs involving the nervous system: Secondary | ICD-10-CM | POA: Diagnosis not present

## 2022-07-27 LAB — I-STAT BETA HCG BLOOD, ED (MC, WL, AP ONLY): I-stat hCG, quantitative: <5 m[IU]/mL (ref <5)

## 2022-07-27 MED ORDER — SODIUM CHLORIDE 0.9 % IV BOLUS
500.0000 mL | Freq: Once | INTRAVENOUS | Status: AC
  Administered 2022-07-27: 500 mL via INTRAVENOUS

## 2022-07-27 NOTE — ED Provider Notes (Signed)
[place] [facility] AT [facility] Provider Note   CSN: [id number] Arrival date & time: 07/26/22  1924     History  Chief Complaint  Patient presents with   Near Syncope    Jasmine Silva is a 37 y.o. female.  The history is provided by the patient and medical records.  Near Syncope  Jasmine Silva is a 37 y.o. female who presents to the Emergency Department complaining of dizziness.  She presents to the emergency department for evaluation of dizziness that started when she woke up at 2 in the afternoon.  She feels unbalanced whenever she tries to move.  This does not get better when she is still.  Later when she tried to get up she felt like she might pass out.  No associated headache, chest pain, abdominal pain.  She does have some nausea.  No dysuria, difficulty breathing, vision changes.  Has a history of prediabetes, no additional medical problems.   No tobacco, alcohol, drugs.  No ocp.  No history of DVT/PE.     Home Medications Prior to Admission medications   Medication Sig Start Date End Date Taking? Authorizing Provider  citalopram (CELEXA) 10 MG tablet Take 1 tablet (10 mg total) by mouth daily. 08/05/20   Bensimhon, Bevelyn Buckles, MD      Allergies    Patient has no known allergies.    Review of Systems   Review of Systems  Cardiovascular:  Positive for near-syncope.  All other systems reviewed and are negative.   Physical Exam Updated Vital Signs BP 116/71 (BP Location: Right Arm)   Pulse 73   Temp 97.7 F (36.5 C) (Oral)   Resp 14   Ht 5\' 3"  (1.6 m)   Wt 77.1 kg   LMP 07/19/2022   SpO2 97%   BMI 30.11 kg/m  Physical Exam Vitals and nursing note reviewed.  Constitutional:      Appearance: She is well-developed.  HENT:     Head: Normocephalic and atraumatic.     Right Ear: Tympanic membrane normal.     Left Ear: Tympanic membrane normal.  Cardiovascular:     Rate and Rhythm: Normal rate and regular rhythm.     Heart sounds: No  murmur heard. Pulmonary:     Effort: Pulmonary effort is normal. No respiratory distress.     Breath sounds: Normal breath sounds.  Abdominal:     Palpations: Abdomen is soft.     Tenderness: There is no abdominal tenderness. There is no guarding or rebound.  Musculoskeletal:        General: No tenderness.     Cervical back: Neck supple.  Skin:    General: Skin is warm and dry.  Neurological:     Mental Status: She is alert and oriented to person, place, and time.     Comments: 5 out of 5 strength in all 4 extremities with sensation to light touch intact in all 4 extremities.  Visual fields grossly intact.  No ataxia on finger-nose bilaterally.  There is a mildly unsteady gait  Psychiatric:        Behavior: Behavior normal.     ED Results / Procedures / Treatments   Labs (all labs ordered are listed, but only abnormal results are displayed) Labs Reviewed  CBC WITH DIFFERENTIAL/PLATELET - Abnormal; Notable for the following components:      Result Value   MCV 73.1 (*)    MCH 24.7 (*)    All other components within normal  limits  BASIC METABOLIC PANEL - Abnormal; Notable for the following components:   Glucose, Bld 137 (*)    All other components within normal limits  MAGNESIUM  I-STAT BETA HCG BLOOD, ED ([facility], [facility], [facility] ONLY)    EKG EKG Interpretation  Date/Time:  Tuesday July 26 2022 19:42:33 EDT Ventricular Rate:  80 PR Interval:  166 QRS Duration: 72 QT Interval:  360 QTC Calculation: 415 R Axis:   37 Text Interpretation: Normal sinus rhythm Nonspecific T wave abnormality Abnormal ECG Confirmed by Tilden Fossa [phone]) on 07/27/2022 12:08:15 AM  Radiology MR BRAIN WO CONTRAST  Result Date: 07/27/2022 CLINICAL DATA:  Acute neurologic deficit EXAM: MRI HEAD WITHOUT CONTRAST TECHNIQUE: Multiplanar, multiecho pulse sequences of the brain and surrounding structures were obtained without intravenous contrast. COMPARISON:  None Available. FINDINGS: Brain: No acute infarct,  mass effect or extra-axial collection. No acute or chronic hemorrhage. Normal white matter signal, parenchymal volume and CSF spaces. The midline structures are normal. Vascular: Major flow voids are preserved. Skull and upper cervical spine: Normal calvarium and skull base. Visualized upper cervical spine and soft tissues are normal. Sinuses/Orbits:No paranasal sinus fluid levels or advanced mucosal thickening. No mastoid or middle ear effusion. Normal orbits. IMPRESSION: Normal brain MRI. Electronically Signed   By: Deatra Robinson M.D.   On: 07/27/2022 03:26    Procedures Procedures    Medications Ordered in ED Medications  sodium chloride 0.9 % bolus 500 mL (0 mLs Intravenous Stopped 07/27/22 0423)    ED Course/ Medical Decision Making/ A&P                             Medical Decision Making Amount and/or Complexity of Data Reviewed Radiology: ordered.   Pt here for evaluation of dizziness, difficulty walking.  No focal deficits on exam but she has unsteady gait.  Labs without significant electrolyte abnormality or anemia.  Given her gait abnormality an MRI was obtained, which is negative for acute process.  On repeat evaluation she is feeling improved.  Current clinical picture is not c/w ACS, arrhythmia, PE, CVA.  D/w pt unclear source of sxs.  Feel pt is stable to d/c home with outpatient follow up and return precautions.          Final Clinical Impression(s) / ED Diagnoses Final diagnoses:  Dizziness    Rx / DC Orders ED Discharge Orders     None         Tilden Fossa, MD 07/27/22 [phone]
